# Patient Record
Sex: Male | Born: 1975
Health system: Southern US, Community
[De-identification: ages and names within clinical notes are randomized; demographics above are authoritative.]

## PROBLEM LIST (undated history)

## (undated) DIAGNOSIS — F32A Depression, unspecified: Secondary | ICD-10-CM

## (undated) DIAGNOSIS — F4310 Post-traumatic stress disorder, unspecified: Secondary | ICD-10-CM

## (undated) DIAGNOSIS — G8929 Other chronic pain: Secondary | ICD-10-CM

## (undated) DIAGNOSIS — E119 Type 2 diabetes mellitus without complications: Secondary | ICD-10-CM

## (undated) DIAGNOSIS — F419 Anxiety disorder, unspecified: Secondary | ICD-10-CM

## (undated) DIAGNOSIS — Z8659 Personal history of other mental and behavioral disorders: Secondary | ICD-10-CM

## (undated) HISTORY — PX: CHOLECYSTECTOMY: SHX55

## (undated) HISTORY — PX: KNEE ARTHROSCOPY: SHX127

## (undated) HISTORY — PX: TONSILLECTOMY: SUR1361

## (undated) HISTORY — PX: BACK SURGERY: SHX140

## (undated) HISTORY — PX: APPENDECTOMY: SHX54

## (undated) NOTE — *Deleted (*Deleted)
Patient ID: Juan Roman, male   DOB: [birth year], 16 y.o.   MRN: [id number]   After ED visit 10/20/2020 for hyperglycemia.  From ED note: This patient complains of fatigue and elevated blood sugars in the setting of not taking his insulin; this involves an extensive number of treatment Options and is a complaint that carries with it a high risk of complications and Morbidity. The differential includes hyperglycemia, DKA, HH NK, metabolic derangement, infection  I ordered, reviewed and interpreted labs, which included CBC with elevated white count,, elevated hemoglobin likely reflecting some dehydration, chemistries with mildly low sodium and elevated glucose, normal gap, urinalysis without signs of infection I ordered medication IV fluids, insulin, Tylenol Previous records obtained and reviewed in epic, patient was here recently and had refill of his insulin I consulted social work and discussed lab and imaging findings  Critical Interventions: None  After the interventions stated above, I reevaluated the patient and found patient's blood sugar to be improving.  Social work has been helpful in getting the patient's medication and follow-up appointment set up for [facility].  Patient agreeable to plan to follow-up with them.  Return instructions discussed.

---

## 2020-08-28 ENCOUNTER — Emergency Department (HOSPITAL_BASED_OUTPATIENT_CLINIC_OR_DEPARTMENT_OTHER): Payer: Medicaid Other

## 2020-08-28 ENCOUNTER — Inpatient Hospital Stay (HOSPITAL_BASED_OUTPATIENT_CLINIC_OR_DEPARTMENT_OTHER)
Admission: EM | Admit: 2020-08-28 | Discharge: 2020-08-31 | DRG: 501 | Disposition: A | Discharge status: Home / Self Care | Payer: Medicaid Other | Attending: Internal Medicine | Admitting: Internal Medicine

## 2020-08-28 ENCOUNTER — Other Ambulatory Visit: Payer: Self-pay

## 2020-08-28 ENCOUNTER — Encounter (HOSPITAL_BASED_OUTPATIENT_CLINIC_OR_DEPARTMENT_OTHER): Payer: Self-pay | Admitting: *Deleted

## 2020-08-28 DIAGNOSIS — B950 Streptococcus, group A, as the cause of diseases classified elsewhere: Secondary | ICD-10-CM | POA: Diagnosis present

## 2020-08-28 DIAGNOSIS — L0211 Cutaneous abscess of neck: Secondary | ICD-10-CM | POA: Diagnosis present

## 2020-08-28 DIAGNOSIS — T383X6A Underdosing of insulin and oral hypoglycemic [antidiabetic] drugs, initial encounter: Secondary | ICD-10-CM | POA: Diagnosis present

## 2020-08-28 DIAGNOSIS — Z886 Allergy status to analgesic agent status: Secondary | ICD-10-CM

## 2020-08-28 DIAGNOSIS — E1165 Type 2 diabetes mellitus with hyperglycemia: Secondary | ICD-10-CM | POA: Diagnosis present

## 2020-08-28 DIAGNOSIS — Z794 Long term (current) use of insulin: Secondary | ICD-10-CM | POA: Diagnosis not present

## 2020-08-28 DIAGNOSIS — L03221 Cellulitis of neck: Secondary | ICD-10-CM | POA: Diagnosis present

## 2020-08-28 DIAGNOSIS — I889 Nonspecific lymphadenitis, unspecified: Secondary | ICD-10-CM

## 2020-08-28 DIAGNOSIS — F17210 Nicotine dependence, cigarettes, uncomplicated: Secondary | ICD-10-CM | POA: Diagnosis present

## 2020-08-28 DIAGNOSIS — M60009 Infective myositis, unspecified site: Secondary | ICD-10-CM

## 2020-08-28 DIAGNOSIS — E119 Type 2 diabetes mellitus without complications: Secondary | ICD-10-CM

## 2020-08-28 DIAGNOSIS — Z8614 Personal history of Methicillin resistant Staphylococcus aureus infection: Secondary | ICD-10-CM | POA: Diagnosis not present

## 2020-08-28 DIAGNOSIS — L040 Acute lymphadenitis of face, head and neck: Secondary | ICD-10-CM | POA: Diagnosis present

## 2020-08-28 DIAGNOSIS — E1169 Type 2 diabetes mellitus with other specified complication: Secondary | ICD-10-CM | POA: Diagnosis not present

## 2020-08-28 DIAGNOSIS — Z20822 Contact with and (suspected) exposure to covid-19: Secondary | ICD-10-CM | POA: Diagnosis present

## 2020-08-28 DIAGNOSIS — E1140 Type 2 diabetes mellitus with diabetic neuropathy, unspecified: Secondary | ICD-10-CM | POA: Diagnosis present

## 2020-08-28 DIAGNOSIS — Z887 Allergy status to serum and vaccine status: Secondary | ICD-10-CM

## 2020-08-28 DIAGNOSIS — I671 Cerebral aneurysm, nonruptured: Secondary | ICD-10-CM | POA: Diagnosis present

## 2020-08-28 DIAGNOSIS — Z885 Allergy status to narcotic agent status: Secondary | ICD-10-CM

## 2020-08-28 DIAGNOSIS — M6008 Infective myositis, other site: Principal | ICD-10-CM | POA: Diagnosis present

## 2020-08-28 DIAGNOSIS — Z91138 Patient's unintentional underdosing of medication regimen for other reason: Secondary | ICD-10-CM | POA: Diagnosis not present

## 2020-08-28 HISTORY — DX: Type 2 diabetes mellitus without complications: E11.9

## 2020-08-28 LAB — CBC WITH DIFFERENTIAL/PLATELET
Abs Immature Granulocytes: 0.47 10*3/uL — ABNORMAL HIGH (ref 0.00–0.07)
Basophils Absolute: 0.2 10*3/uL — ABNORMAL HIGH (ref 0.0–0.1)
Basophils Relative: 1 %
Eosinophils Absolute: 0.5 10*3/uL (ref 0.0–0.5)
Eosinophils Relative: 2 %
HCT: 43.1 % (ref 39.0–52.0)
Hemoglobin: 14.5 g/dL (ref 13.0–17.0)
Immature Granulocytes: 2 %
Lymphocytes Relative: 18 %
Lymphs Abs: 4 10*3/uL (ref 0.7–4.0)
MCH: 28.4 pg (ref 26.0–34.0)
MCHC: 33.6 g/dL (ref 30.0–36.0)
MCV: 84.3 fL (ref 80.0–100.0)
Monocytes Absolute: 1.3 10*3/uL — ABNORMAL HIGH (ref 0.1–1.0)
Monocytes Relative: 6 %
Neutro Abs: 15.7 10*3/uL — ABNORMAL HIGH (ref 1.7–7.7)
Neutrophils Relative %: 71 %
Platelets: 298 10*3/uL (ref 150–400)
RBC: 5.11 MIL/uL (ref 4.22–5.81)
RDW: 13.6 % (ref 11.5–15.5)
WBC: 22.1 10*3/uL — ABNORMAL HIGH (ref 4.0–10.5)
nRBC: 0 % (ref 0.0–0.2)

## 2020-08-28 LAB — CBG MONITORING, ED
Glucose-Capillary: 348 mg/dL — ABNORMAL HIGH (ref 70–99)
Glucose-Capillary: 377 mg/dL — ABNORMAL HIGH (ref 70–99)

## 2020-08-28 LAB — URINALYSIS, ROUTINE W REFLEX MICROSCOPIC
Bilirubin Urine: NEGATIVE
Glucose, UA: >=500 mg/dL — AB
Ketones, ur: NEGATIVE mg/dL
Leukocytes,Ua: NEGATIVE
Nitrite: NEGATIVE
Protein, ur: NEGATIVE mg/dL
Specific Gravity, Urine: <1.005 — ABNORMAL LOW (ref 1.005–1.030)
pH: 7 (ref 5.0–8.0)

## 2020-08-28 LAB — BASIC METABOLIC PANEL
Anion gap: 9 (ref 5–15)
BUN: 11 mg/dL (ref 6–20)
CO2: 22 mmol/L (ref 22–32)
Calcium: 8.9 mg/dL (ref 8.9–10.3)
Chloride: 99 mmol/L (ref 98–111)
Creatinine, Ser: 0.63 mg/dL (ref 0.61–1.24)
GFR calc Af Amer: >60 mL/min (ref >60)
GFR calc non Af Amer: >60 mL/min (ref >60)
Glucose, Bld: 553 mg/dL (ref 70–99)
Potassium: 4.2 mmol/L (ref 3.5–5.1)
Sodium: 130 mmol/L — ABNORMAL LOW (ref 135–145)

## 2020-08-28 LAB — RAPID URINE DRUG SCREEN, HOSP PERFORMED
Amphetamines: NOT DETECTED
Barbiturates: NOT DETECTED
Benzodiazepines: NOT DETECTED
Cocaine: NOT DETECTED
Opiates: NOT DETECTED
Tetrahydrocannabinol: POSITIVE — AB

## 2020-08-28 LAB — CK: Total CK: 31 U/L — ABNORMAL LOW (ref 49–397)

## 2020-08-28 LAB — URINALYSIS, MICROSCOPIC (REFLEX)
Bacteria, UA: NONE SEEN
Squamous Epithelial / HPF: NONE SEEN (ref 0–5)
WBC, UA: NONE SEEN WBC/hpf (ref 0–5)

## 2020-08-28 LAB — HEMOGLOBIN A1C
Hgb A1c MFr Bld: 13.3 % — ABNORMAL HIGH (ref 4.8–5.6)
Mean Plasma Glucose: 335.01 mg/dL

## 2020-08-28 LAB — HIV ANTIBODY (ROUTINE TESTING W REFLEX): HIV Screen 4th Generation wRfx: NONREACTIVE

## 2020-08-28 LAB — GLUCOSE, CAPILLARY
Glucose-Capillary: 430 mg/dL — ABNORMAL HIGH (ref 70–99)
Glucose-Capillary: 276 mg/dL — ABNORMAL HIGH (ref 70–99)
Glucose-Capillary: 328 mg/dL — ABNORMAL HIGH (ref 70–99)

## 2020-08-28 LAB — SARS CORONAVIRUS 2 BY RT PCR (HOSPITAL ORDER, PERFORMED IN ~~LOC~~ HOSPITAL LAB): SARS Coronavirus 2: NEGATIVE

## 2020-08-28 LAB — MRSA PCR SCREENING: MRSA by PCR: NEGATIVE

## 2020-08-28 MED ORDER — INSULIN GLARGINE 100 UNIT/ML ~~LOC~~ SOLN
30.0000 [IU] | Freq: Every day | SUBCUTANEOUS | Status: DC
  Administered 2020-08-28 – 2020-08-29 (×2): 30 [IU] via SUBCUTANEOUS
  Filled 2020-08-28 (×4): qty 0.3

## 2020-08-28 MED ORDER — INSULIN ASPART 100 UNIT/ML ~~LOC~~ SOLN
6.0000 [IU] | Freq: Three times a day (TID) | SUBCUTANEOUS | Status: DC
  Administered 2020-08-28 – 2020-08-31 (×7): 6 [IU] via SUBCUTANEOUS

## 2020-08-28 MED ORDER — ACETAMINOPHEN 500 MG PO TABS
1000.0000 mg | ORAL_TABLET | Freq: Once | ORAL | Status: AC
  Administered 2020-08-28: 1000 mg via ORAL
  Filled 2020-08-28: qty 2

## 2020-08-28 MED ORDER — VANCOMYCIN HCL IN DEXTROSE 1-5 GM/200ML-% IV SOLN
1000.0000 mg | Freq: Once | INTRAVENOUS | Status: AC
  Administered 2020-08-28: 1000 mg via INTRAVENOUS
  Filled 2020-08-28: qty 200

## 2020-08-28 MED ORDER — INSULIN ASPART 100 UNIT/ML ~~LOC~~ SOLN
4.0000 [IU] | SUBCUTANEOUS | Status: AC
  Administered 2020-08-28: 4 [IU] via SUBCUTANEOUS
  Filled 2020-08-28: qty 4

## 2020-08-28 MED ORDER — FENTANYL CITRATE (PF) 100 MCG/2ML IJ SOLN
50.0000 ug | Freq: Once | INTRAMUSCULAR | Status: AC
  Administered 2020-08-28: 50 ug via INTRAVENOUS
  Filled 2020-08-28: qty 2

## 2020-08-28 MED ORDER — PIPERACILLIN-TAZOBACTAM 3.375 G IVPB 30 MIN
3.3750 g | Freq: Once | INTRAVENOUS | Status: AC
  Administered 2020-08-28: 3.375 g via INTRAVENOUS
  Filled 2020-08-28 (×2): qty 50

## 2020-08-28 MED ORDER — INSULIN ASPART 100 UNIT/ML ~~LOC~~ SOLN
SUBCUTANEOUS | Status: AC
  Administered 2020-08-28: 5 [IU] via INTRAVENOUS
  Filled 2020-08-28: qty 5

## 2020-08-28 MED ORDER — INSULIN ASPART 100 UNIT/ML ~~LOC~~ SOLN
0.0000 [IU] | Freq: Three times a day (TID) | SUBCUTANEOUS | Status: DC
  Administered 2020-08-28: 8 [IU] via SUBCUTANEOUS
  Administered 2020-08-28 – 2020-08-31 (×7): 15 [IU] via SUBCUTANEOUS

## 2020-08-28 MED ORDER — INSULIN ASPART 100 UNIT/ML ~~LOC~~ SOLN
0.0000 [IU] | Freq: Every day | SUBCUTANEOUS | Status: DC
  Administered 2020-08-28: 4 [IU] via SUBCUTANEOUS
  Administered 2020-08-29: 5 [IU] via SUBCUTANEOUS
  Administered 2020-08-30: 2 [IU] via SUBCUTANEOUS

## 2020-08-28 MED ORDER — INSULIN ASPART 100 UNIT/ML IV SOLN
5.0000 [IU] | Freq: Once | INTRAVENOUS | Status: AC
  Filled 2020-08-28: qty 0.05

## 2020-08-28 MED ORDER — INSULIN REGULAR HUMAN 100 UNIT/ML IJ SOLN
4.0000 [IU] | Freq: Once | INTRAMUSCULAR | Status: DC
  Filled 2020-08-28: qty 3

## 2020-08-28 MED ORDER — ACETAMINOPHEN 500 MG PO TABS
1000.0000 mg | ORAL_TABLET | Freq: Three times a day (TID) | ORAL | Status: DC
  Administered 2020-08-28 – 2020-08-31 (×5): 1000 mg via ORAL
  Filled 2020-08-28 (×8): qty 2

## 2020-08-28 MED ORDER — VANCOMYCIN HCL 1500 MG/300ML IV SOLN
1500.0000 mg | Freq: Two times a day (BID) | INTRAVENOUS | Status: DC
  Administered 2020-08-28 – 2020-08-30 (×5): 1500 mg via INTRAVENOUS
  Filled 2020-08-28 (×5): qty 300

## 2020-08-28 MED ORDER — OXYCODONE HCL 5 MG PO TABS
5.0000 mg | ORAL_TABLET | ORAL | Status: DC | PRN
  Administered 2020-08-28 – 2020-08-29 (×7): 5 mg via ORAL
  Filled 2020-08-28 (×7): qty 1

## 2020-08-28 MED ORDER — PIPERACILLIN-TAZOBACTAM 3.375 G IVPB
3.3750 g | Freq: Three times a day (TID) | INTRAVENOUS | Status: DC
  Administered 2020-08-28 – 2020-08-31 (×9): 3.375 g via INTRAVENOUS
  Filled 2020-08-28 (×8): qty 50

## 2020-08-28 MED ORDER — INSULIN GLARGINE 100 UNIT/ML ~~LOC~~ SOLN
38.0000 [IU] | Freq: Every day | SUBCUTANEOUS | Status: DC
  Filled 2020-08-28: qty 0.38

## 2020-08-28 MED ORDER — ENOXAPARIN SODIUM 40 MG/0.4ML ~~LOC~~ SOLN
40.0000 mg | Freq: Every day | SUBCUTANEOUS | Status: DC
  Filled 2020-08-28: qty 0.4

## 2020-08-28 MED ORDER — IOHEXOL 300 MG/ML  SOLN
100.0000 mL | Freq: Once | INTRAMUSCULAR | Status: AC | PRN
  Administered 2020-08-28: 75 mL via INTRAVENOUS

## 2020-08-28 MED ORDER — MELATONIN 5 MG PO TABS
5.0000 mg | ORAL_TABLET | Freq: Every day | ORAL | Status: DC
  Administered 2020-08-28 – 2020-08-30 (×3): 5 mg via ORAL
  Filled 2020-08-28 (×3): qty 1

## 2020-08-28 MED ORDER — GABAPENTIN 400 MG PO CAPS
400.0000 mg | ORAL_CAPSULE | Freq: Three times a day (TID) | ORAL | Status: DC
  Administered 2020-08-28 – 2020-08-31 (×9): 400 mg via ORAL
  Filled 2020-08-28 (×9): qty 1

## 2020-08-28 MED ORDER — ACETAMINOPHEN 500 MG PO TABS
1000.0000 mg | ORAL_TABLET | Freq: Four times a day (QID) | ORAL | Status: DC | PRN
  Administered 2020-08-28: 1000 mg via ORAL
  Filled 2020-08-28: qty 2

## 2020-08-28 MED ORDER — MORPHINE SULFATE (PF) 4 MG/ML IV SOLN
4.0000 mg | INTRAVENOUS | Status: DC | PRN
  Administered 2020-08-28 – 2020-08-29 (×2): 4 mg via INTRAVENOUS
  Filled 2020-08-28 (×2): qty 1

## 2020-08-28 MED ORDER — SODIUM CHLORIDE 0.9 % IV BOLUS
500.0000 mL | Freq: Once | INTRAVENOUS | Status: AC
  Administered 2020-08-28: 500 mL via INTRAVENOUS

## 2020-08-28 MED ORDER — LACTATED RINGERS IV SOLN: INTRAVENOUS | Status: DC

## 2020-08-28 MED ORDER — HALOPERIDOL LACTATE 5 MG/ML IJ SOLN
2.0000 mg | Freq: Once | INTRAMUSCULAR | Status: AC
  Administered 2020-08-28: 2 mg via INTRAVENOUS
  Filled 2020-08-28: qty 1

## 2020-08-28 NOTE — Progress Notes (Signed)
Received patient from ED Med center, and only got report from [facility] nurse.

## 2020-08-28 NOTE — Progress Notes (Signed)
Attempted to return call ED nurse Med center to get report, secretary said RN busy.

## 2020-08-28 NOTE — ED Triage Notes (Signed)
Per EMS: pt from hotel. States that pt has had neck pain with swelling (x 2 months-without injury). Pt a diabetic-noncompliant with meds. CBG 539. VSS. 18ga LAC, 500cc bolus.

## 2020-08-28 NOTE — ED Notes (Signed)
Attempted to call report to floor, no answer by primary RN

## 2020-08-28 NOTE — Consult Note (Signed)
[facility] Surgery Consult Note  Juan Roman 1976/05/18  [id number].    Requesting MD: Chipper Herb Chief Complaint/Reason for Consult: posterior neck abscess HPI:  Patient is a 44 year old male who presented to [facility] with hyperglycemia and neck swelling x1 week. He reports he noticed neck swelling about a week ago and tenderness. Pain and swelling has progressively worsened since onset. He denies fever, chills, chest pain, SOB, abdominal pain, nausea, vomiting, change in peripheral neuropathy. He reports that he has had a skin infection on his back previously that had to be drained and packed. He reports he has not been taking insulin as directed because he had been providing care for someone else. His "old lady" died within the last 2 weeks and this has been very emotionally tough for him. He does not report any other medical issues. Past surgical hx includes appendectomy and cholecystectomy. He is allergic to tramadol and toradol with hives as reaction. He does not take any blood thinning medications. He reports rare alcohol use, smokes 1/2 ppd, reports occasional marijuana use. 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 He is on disability.   ROS: Review of Systems  Constitutional: Negative for chills and fever.  Respiratory: Negative for shortness of breath and wheezing.   Cardiovascular: Negative for chest pain and palpitations.  Gastrointestinal: Negative for abdominal pain, constipation, diarrhea, nausea and vomiting.  Genitourinary: Negative for dysuria, frequency and urgency.  Musculoskeletal: Positive for neck pain.  All other systems reviewed and are negative.   History reviewed. No pertinent family history.  Past Medical History:  Diagnosis Date  . Diabetes mellitus without complication [facility])     Past Surgical History:  Procedure Laterality Date  . BACK SURGERY    . TONSILLECTOMY      Social History:  reports that he has been smoking cigarettes. He has  been smoking about 1.00 pack per day. He has never used smokeless tobacco. He reports previous alcohol use. He reports current drug use. Drug: Marijuana.  Allergies:  Allergies  Allergen Reactions  . Influenza Virus Vac Live Quad Anaphylaxis  . Nsaids Nausea And Vomiting  . Toradol [Ketorolac Tromethamine]   . Tramadol     Medications Prior to Admission  Medication Sig Dispense Refill  . gabapentin (NEURONTIN) 400 MG capsule Take 400 mg by mouth 4 (four) times daily as needed.    Marland Kitchen HUMULIN R 100 UNIT/ML injection Inject 10 Units into the skin 3 (three) times daily.    Marland Kitchen LANTUS 100 UNIT/ML injection Inject 38 Units into the skin at bedtime.      Blood pressure (!) 135/92, pulse (!) 101, temperature 99.6 F (37.6 C), temperature source Oral, resp. rate 18, height 5\' 7"  (1.702 m), weight 86.2 kg, SpO2 97 %. Physical Exam:  General: pleasant, WD, WN white male who is laying in bed in NAD HEENT: head is normocephalic, atraumatic.  Sclera are noninjected.  PERRL.  Ears and nose without any masses or lesions.  Mouth is pink and moist Neck: posterior left neck with erythema and induration extending down to left subclavian space, very ttp  Heart: regular, rate, and rhythm. Palpable radial and pedal pulses bilaterally Lungs: CTAB, no wheezes, rhonchi, or rales noted.  Respiratory effort nonlabored Abd: soft, NT, ND, +BS, no masses, hernias, or organomegaly MS: all 4 extremities are symmetrical with no cyanosis, clubbing, or edema. Skin: warm and dry with no masses, lesions, or rashes Neuro: Cranial nerves 2-12 grossly intact, sensation grossly  intact throughout  Psych: A&Ox3 with an appropriate affect.   Results for orders placed or performed during the hospital encounter of 08/28/20 (from the past 48 hour(s))  CBC with Differential/Platelet     Status: Abnormal   Collection Time: 08/28/20  3:13 AM  Result Value Ref Range   WBC 22.1 (H) 4.0 - 10.5 K/uL   RBC 5.11 4.22 - 5.81 MIL/uL    Hemoglobin 14.5 13.0 - 17.0 g/dL   HCT 21.3 39 - 52 %   MCV 84.3 80.0 - 100.0 fL   MCH 28.4 26.0 - 34.0 pg   MCHC 33.6 30.0 - 36.0 g/dL   RDW 08.6 57.8 - 46.9 %   Platelets 298 150 - 400 K/uL   nRBC 0.0 0.0 - 0.2 %   Neutrophils Relative % 71 %   Neutro Abs 15.7 (H) 1.7 - 7.7 K/uL   Lymphocytes Relative 18 %   Lymphs Abs 4.0 0.7 - 4.0 K/uL   Monocytes Relative 6 %   Monocytes Absolute 1.3 (H) 0 - 1 K/uL   Eosinophils Relative 2 %   Eosinophils Absolute 0.5 0 - 0 K/uL   Basophils Relative 1 %   Basophils Absolute 0.2 (H) 0 - 0 K/uL   Immature Granulocytes 2 %   Abs Immature Granulocytes 0.47 (H) 0.00 - 0.07 K/uL    Comment: Performed at [facility], [address] [place], [place] [address]  Basic metabolic panel     Status: Abnormal   Collection Time: 08/28/20  3:13 AM  Result Value Ref Range   Sodium 130 (L) 135 - 145 mmol/L   Potassium 4.2 3.5 - 5.1 mmol/L   Chloride 99 98 - 111 mmol/L   CO2 22 22 - 32 mmol/L   Glucose, Bld 553 (HH) 70 - 99 mg/dL    Comment: Glucose reference range applies only to samples taken after fasting for at least 8 hours. CRITICAL RESULT CALLED TO, READ BACK BY AND VERIFIED WITH: WALTON,M AT 0354 ON [id number] BY CHERESNOWSKY,T LIPEMIC SPECIMEN    BUN 11 6 - 20 mg/dL   Creatinine, Ser 4.01 0.61 - 1.24 mg/dL   Calcium 8.9 8.9 - 02.7 mg/dL   GFR calc non Af Amer >60 >60 mL/min   GFR calc Af Amer >60 >60 mL/min   Anion gap 9 5 - 15    Comment: Performed at [facility], [address] [place], [place] [address]  Urinalysis, Routine w reflex microscopic Urine, Clean Catch     Status: Abnormal   Collection Time: 08/28/20  3:13 AM  Result Value Ref Range   Color, Urine COLORLESS (A) YELLOW   APPearance CLEAR CLEAR   Specific Gravity, Urine <1.005 (L) 1.005 - 1.030   pH 7.0 5.0 - 8.0   Glucose, UA >=500 (A) NEGATIVE mg/dL   Hgb urine dipstick TRACE (A) NEGATIVE   Bilirubin Urine NEGATIVE NEGATIVE   Ketones, ur NEGATIVE  NEGATIVE mg/dL   Protein, ur NEGATIVE NEGATIVE mg/dL   Nitrite NEGATIVE NEGATIVE   Leukocytes,Ua NEGATIVE NEGATIVE    Comment: Performed at [facility], [address] [place], [place] [address]  Urinalysis, Microscopic (reflex)     Status: None   Collection Time: 08/28/20  3:13 AM  Result Value Ref Range   RBC / HPF 0-5 0 - 5 RBC/hpf   WBC, UA NONE SEEN 0 - 5 WBC/hpf   Bacteria, UA NONE SEEN NONE SEEN   Squamous Epithelial /  LPF NONE SEEN 0 - 5    Comment: Performed at [facility] [place], [address] [place], [place] [address]  SARS Coronavirus 2 by RT PCR (hospital order, performed in [facility] hospital lab) Nasopharyngeal Nasopharyngeal Swab     Status: None   Collection Time: 08/28/20  6:16 AM   Specimen: Nasopharyngeal Swab  Result Value Ref Range   SARS Coronavirus 2 NEGATIVE NEGATIVE    Comment: (NOTE) SARS-CoV-2 target nucleic acids are NOT DETECTED.  The SARS-CoV-2 RNA is generally detectable in upper and lower respiratory specimens during the acute phase of infection. The lowest concentration of SARS-CoV-2 viral copies this assay can detect is 250 copies / mL. A negative result does not preclude SARS-CoV-2 infection and should not be used as the sole basis for treatment or other patient management decisions.  A negative result may occur with improper specimen collection / handling, submission of specimen other than nasopharyngeal swab, presence of viral mutation(s) within the areas targeted by this assay, and inadequate number of viral copies (<250 copies / mL). A negative result must be combined with clinical observations, patient history, and epidemiological information.  Fact Sheet for Patients:   BoilerBrush.com.cyhttps://www.fda.gov/media/136312/download  Fact Sheet for Healthcare Providers: https://pope.com/https://www.fda.gov/media/136313/download  This test is not yet approved or  cleared by the [place] FDA and has been authorized for detection and/or diagnosis of  SARS-CoV-2 by FDA under an Emergency Use Authorization (EUA).  This EUA will remain in effect (meaning this test can be used) for the duration of the COVID-19 declaration under Section 564(b)(1) of the Act, 21 U.S.C. section 360bbb-3(b)(1), unless the authorization is terminated or revoked sooner.  Performed at [facility], [address] [place], [place] [address]   Rapid urine drug screen (hospital performed)     Status: Abnormal   Collection Time: 08/28/20  6:21 AM  Result Value Ref Range   Opiates NONE DETECTED NONE DETECTED   Cocaine NONE DETECTED NONE DETECTED   Benzodiazepines NONE DETECTED NONE DETECTED   Amphetamines NONE DETECTED NONE DETECTED   Tetrahydrocannabinol POSITIVE (A) NONE DETECTED   Barbiturates NONE DETECTED NONE DETECTED    Comment: (NOTE) DRUG SCREEN FOR MEDICAL PURPOSES ONLY.  IF CONFIRMATION IS NEEDED FOR ANY PURPOSE, NOTIFY LAB WITHIN 5 DAYS.  LOWEST DETECTABLE LIMITS FOR URINE DRUG SCREEN Drug Class                     Cutoff (ng/mL) Amphetamine and metabolites    1000 Barbiturate and metabolites    200 Benzodiazepine                 200 Tricyclics and metabolites     300 Opiates and metabolites        300 Cocaine and metabolites        300 THC                            50 Performed at [facility] [place], [address] [place], [place] [address]   CBG monitoring, ED     Status: Abnormal   Collection Time: 08/28/20  7:42 AM  Result Value Ref Range   Glucose-Capillary 348 (H) 70 - 99 mg/dL    Comment: Glucose reference range applies only to samples taken after fasting for at least 8 hours.  CBG monitoring, ED     Status: Abnormal   Collection Time: 08/28/20 11:18 AM  Result Value Ref Range   Glucose-Capillary 377 (H) 70 - 99 mg/dL    Comment: Glucose reference range applies only to samples taken after fasting for at least 8 hours.  Glucose, capillary     Status: Abnormal   Collection Time: 08/28/20 12:40 PM  Result  Value Ref Range   Glucose-Capillary 430 (H) 70 - 99 mg/dL    Comment: Glucose reference range applies only to samples taken after fasting for at least 8 hours.   CT Soft Tissue Neck W Contrast  Result Date: 08/28/2020 CLINICAL DATA:  Neck pain with swelling for 2 months. Non compliant diabetic EXAM: CT NECK WITH CONTRAST TECHNIQUE: Multidetector CT imaging of the neck was performed using the standard protocol following the bolus administration of intravenous contrast. CONTRAST:  69mL OMNIPAQUE IOHEXOL 300 MG/ML  SOLN COMPARISON:  None. FINDINGS: Pharynx and larynx: No evidence of mass or swelling. Salivary glands: No inflammation, mass, or stone. Thyroid: Normal. Lymph nodes: There are 2 thick-walled cavities along the junction between the subcutaneous fat and deep fascia the level of the left posterior neck, measuring up to 2 cm. Suspect these are cavitary nodes. No contralateral or non cavitated adenopathy. 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 No gross beading of the contralateral ICA. Limited intracranial: Negative Visualized orbits: Negative Mastoids and visualized paranasal sinuses: Clear Skeleton: Muscular expansion and low-density in the left intrinsic neck muscles. Extensive dental caries and periapical erosions. Degenerative endplate spurring. Upper chest: Negative IMPRESSION: 1. Extensive myositis of left posterior intrinsic neck musculature associated with fluid collections along the deep fascial layer, likely cavitated nodes. Suppurative adenitis or tuberculous adenitis are leading considerations. Would not expect this degree of inflammation for cavitating tumor. 2. 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 In this location a posttraumatic cause is likely. Electronically Signed   By: Marnee Spring M.D.   On: 08/28/2020 05:18   DG Chest Portable 1 View  Result Date: 08/28/2020 CLINICAL DATA:   Abnormal CT. EXAM: PORTABLE CHEST 1 VIEW COMPARISON:  CT neck 9 08/2020 FINDINGS: Mediastinum and hilar structures normal. Heart size normal. Low lung volumes. Tiny calcified nodule left lung base suggesting tiny calcified granuloma. No focal infiltrate. No evidence of active TB. No pleural effusion or pneumothorax. No acute bony abnormality. IMPRESSION: Tiny calcified nodule left lung base suggesting tiny calcified granuloma. No focal infiltrate. No evidence of active TB. Electronically Signed   By: Maisie Fus  Register   On: 08/28/2020 05:53      Assessment/Plan Uncontrolled diabetes mellitus - per primary team   Posterior neck abscess and cellulitis  - WBC 22, afeb - CT shows extensive myositis of left posterior neck with fluid collections along the deep fascial later - this is too deep to drain at bedside - patient has just eaten lunch so will plan for I&D in OR tomorrow   Juliet Rude, PA-C [facility] 08/28/2020, 2:45 PM Please see Amion for pager number during day hours 7:00am-4:30pm

## 2020-08-28 NOTE — ED Notes (Signed)
Patient transported to CT 

## 2020-08-28 NOTE — ED Provider Notes (Signed)
[facility] EMERGENCY DEPARTMENT Provider Note   CSN: [id number] Arrival date & time: 08/28/20  0241     History Chief Complaint  Patient presents with  . Hyperglycemia    Juan Roman is a 44 y.o. male.  The history is provided by the patient and medical records.  Hyperglycemia Blood sugar level PTA:  Unknown has been off meds for weeks  Severity:  Severe Onset quality:  Gradual Timing:  Constant Progression:  Worsening Chronicity:  Chronic Diabetes status:  Controlled with insulin Current diabetic therapy:  Insulin  Context: noncompliance   Relieved by:  Nothing Ineffective treatments:  None tried Associated symptoms: no abdominal pain, no altered mental status, no dizziness, no dysuria, no fatigue, no fever, no increased appetite, no increased thirst, no malaise, no nausea, no polyuria, no shortness of breath, no syncope, no vomiting, no weakness and no weight change   Associated symptoms comment:  Posterior left neck swelling x 2 months Risk factors: no pancreatic disease   Patient with h/o IVDA type 1 DM with non presents with non compliance presents with hyperglycemia and Left posterior neck swelling x 2 months.  No f/c/r.  No sore throat.  No change in phonation.  Has not used insulin in several weeks.       Past Medical History:  Diagnosis Date  . Diabetes mellitus without complication [facility])     Patient Active Problem List   Diagnosis Date Noted  . Cellulitis of neck 08/28/2020    Past Surgical History:  Procedure Laterality Date  . BACK SURGERY    . TONSILLECTOMY         History reviewed. No pertinent family history.  Social History   Tobacco Use  . Smoking status: Current Every Day Smoker    Packs/day: 1.00    Types: Cigarettes  . Smokeless tobacco: Never Used  Substance Use Topics  . Alcohol use: Not Currently  . Drug use: Yes    Types: Marijuana    Home Medications Prior to Admission medications   Not on File     Allergies    Toradol [ketorolac tromethamine] and Tramadol  Review of Systems   Review of Systems  Constitutional: Negative for fatigue and fever.  Respiratory: Negative for shortness of breath.   Cardiovascular: Negative for syncope.  Gastrointestinal: Negative for abdominal pain, nausea and vomiting.  Endocrine: Negative for polydipsia and polyuria.  Genitourinary: Negative for dysuria.  Musculoskeletal: Positive for neck pain.  Skin: Negative for rash.  Neurological: Negative for dizziness and weakness.  Psychiatric/Behavioral: Negative for agitation.  All other systems reviewed and are negative.   Physical Exam Updated Vital Signs BP (!) 131/95 (BP Location: Right Arm)   Pulse 98   Temp 99.3 F (37.4 C) (Oral)   Resp 18   Ht 5\' 7"  (1.702 m)   Wt 86.2 kg   SpO2 97%   BMI 29.76 kg/m   Physical Exam Vitals and nursing note reviewed.  Constitutional:      General: He is not in acute distress.    Appearance: Normal appearance.  HENT:     Head: Normocephalic and atraumatic.     Nose: Nose normal.     Mouth/Throat:     Mouth: Mucous membranes are moist.  Eyes:     Conjunctiva/sclera: Conjunctivae normal.     Pupils: Pupils are equal, round, and reactive to light.  Neck:   Cardiovascular:     Rate and Rhythm: Normal rate and regular rhythm.  Pulses: Normal pulses.     Heart sounds: Normal heart sounds.  Pulmonary:     Effort: Pulmonary effort is normal.     Breath sounds: Normal breath sounds.  Abdominal:     General: Abdomen is flat. Bowel sounds are normal.     Palpations: Abdomen is soft.     Tenderness: There is no abdominal tenderness. There is no guarding or rebound.  Musculoskeletal:        General: Normal range of motion.     Cervical back: No rigidity.  Skin:    General: Skin is warm and dry.     Capillary Refill: Capillary refill takes less than 2 seconds.     Comments: Skin popping site on B forearms dorsal and volar   Neurological:      General: No focal deficit present.     Mental Status: He is alert and oriented to person, place, and time.  Psychiatric:        Mood and Affect: Mood normal.        Behavior: Behavior normal.     ED Results / Procedures / Treatments   Labs (all labs ordered are listed, but only abnormal results are displayed) Results for orders placed or performed during the hospital encounter of 08/28/20  CBC with Differential/Platelet  Result Value Ref Range   WBC 22.1 (H) 4.0 - 10.5 K/uL   RBC 5.11 4.22 - 5.81 MIL/uL   Hemoglobin 14.5 13.0 - 17.0 g/dL   HCT 52.843.1 39 - 52 %   MCV 84.3 80.0 - 100.0 fL   MCH 28.4 26.0 - 34.0 pg   MCHC 33.6 30.0 - 36.0 g/dL   RDW 41.313.6 24.411.5 - 01.015.5 %   Platelets 298 150 - 400 K/uL   nRBC 0.0 0.0 - 0.2 %   Neutrophils Relative % 71 %   Neutro Abs 15.7 (H) 1.7 - 7.7 K/uL   Lymphocytes Relative 18 %   Lymphs Abs 4.0 0.7 - 4.0 K/uL   Monocytes Relative 6 %   Monocytes Absolute 1.3 (H) 0 - 1 K/uL   Eosinophils Relative 2 %   Eosinophils Absolute 0.5 0 - 0 K/uL   Basophils Relative 1 %   Basophils Absolute 0.2 (H) 0 - 0 K/uL   Immature Granulocytes 2 %   Abs Immature Granulocytes 0.47 (H) 0.00 - 0.07 K/uL  Basic metabolic panel  Result Value Ref Range   Sodium 130 (L) 135 - 145 mmol/L   Potassium 4.2 3.5 - 5.1 mmol/L   Chloride 99 98 - 111 mmol/L   CO2 22 22 - 32 mmol/L   Glucose, Bld 553 (HH) 70 - 99 mg/dL   BUN 11 6 - 20 mg/dL   Creatinine, Ser 2.720.63 0.61 - 1.24 mg/dL   Calcium 8.9 8.9 - 53.610.3 mg/dL   GFR calc non Af Amer >60 >60 mL/min   GFR calc Af Amer >60 >60 mL/min   Anion gap 9 5 - 15  Urinalysis, Routine w reflex microscopic Urine, Clean Catch  Result Value Ref Range   Color, Urine COLORLESS (A) YELLOW   APPearance CLEAR CLEAR   Specific Gravity, Urine <1.005 (L) 1.005 - 1.030   pH 7.0 5.0 - 8.0   Glucose, UA >=500 (A) NEGATIVE mg/dL   Hgb urine dipstick TRACE (A) NEGATIVE   Bilirubin Urine NEGATIVE NEGATIVE   Ketones, ur NEGATIVE NEGATIVE mg/dL    Protein, ur NEGATIVE NEGATIVE mg/dL   Nitrite NEGATIVE NEGATIVE   Leukocytes,Ua NEGATIVE NEGATIVE  Urinalysis, Microscopic (reflex)  Result Value Ref Range   RBC / HPF 0-5 0 - 5 RBC/hpf   WBC, UA NONE SEEN 0 - 5 WBC/hpf   Bacteria, UA NONE SEEN NONE SEEN   Squamous Epithelial / LPF NONE SEEN 0 - 5   CT Soft Tissue Neck W Contrast  Result Date: 08/28/2020 CLINICAL DATA:  Neck pain with swelling for 2 months. Non compliant diabetic EXAM: CT NECK WITH CONTRAST TECHNIQUE: Multidetector CT imaging of the neck was performed using the standard protocol following the bolus administration of intravenous contrast. CONTRAST:  39mL OMNIPAQUE IOHEXOL 300 MG/ML  SOLN COMPARISON:  None. FINDINGS: Pharynx and larynx: No evidence of mass or swelling. Salivary glands: No inflammation, mass, or stone. Thyroid: Normal. Lymph nodes: There are 2 thick-walled cavities along the junction between the subcutaneous fat and deep fascia the level of the left posterior neck, measuring up to 2 cm. Suspect these are cavitary nodes. No contralateral or non cavitated adenopathy. 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 No gross beading of the contralateral ICA. Limited intracranial: Negative Visualized orbits: Negative Mastoids and visualized paranasal sinuses: Clear Skeleton: Muscular expansion and low-density in the left intrinsic neck muscles. Extensive dental caries and periapical erosions. Degenerative endplate spurring. Upper chest: Negative IMPRESSION: 1. Extensive myositis of left posterior intrinsic neck musculature associated with fluid collections along the deep fascial layer, likely cavitated nodes. Suppurative adenitis or tuberculous adenitis are leading considerations. Would not expect this degree of inflammation for cavitating tumor. 2. 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 In this location a posttraumatic cause is  likely. Electronically Signed   By: Marnee Spring M.D.   On: 08/28/2020 05:18   DG Chest Portable 1 View  Result Date: 08/28/2020 CLINICAL DATA:  Abnormal CT. EXAM: PORTABLE CHEST 1 VIEW COMPARISON:  CT neck 9 08/2020 FINDINGS: Mediastinum and hilar structures normal. Heart size normal. Low lung volumes. Tiny calcified nodule left lung base suggesting tiny calcified granuloma. No focal infiltrate. No evidence of active TB. No pleural effusion or pneumothorax. No acute bony abnormality. IMPRESSION: Tiny calcified nodule left lung base suggesting tiny calcified granuloma. No focal infiltrate. No evidence of active TB. Electronically Signed   By: Maisie Fus  Register   On: 08/28/2020 05:53    None  Radiology CT Soft Tissue Neck W Contrast  Result Date: 08/28/2020 CLINICAL DATA:  Neck pain with swelling for 2 months. Non compliant diabetic EXAM: CT NECK WITH CONTRAST TECHNIQUE: Multidetector CT imaging of the neck was performed using the standard protocol following the bolus administration of intravenous contrast. CONTRAST:  13mL OMNIPAQUE IOHEXOL 300 MG/ML  SOLN COMPARISON:  None. FINDINGS: Pharynx and larynx: No evidence of mass or swelling. Salivary glands: No inflammation, mass, or stone. Thyroid: Normal. Lymph nodes: There are 2 thick-walled cavities along the junction between the subcutaneous fat and deep fascia the level of the left posterior neck, measuring up to 2 cm. Suspect these are cavitary nodes. No contralateral or non cavitated adenopathy. 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 No gross beading of the contralateral ICA. Limited intracranial: Negative Visualized orbits: Negative Mastoids and visualized paranasal sinuses: Clear Skeleton: Muscular expansion and low-density in the left intrinsic neck muscles. Extensive dental caries and periapical erosions. Degenerative endplate spurring. Upper chest: Negative IMPRESSION: 1.  Extensive myositis of left posterior intrinsic  neck musculature associated with fluid collections along the deep fascial layer, likely cavitated nodes. Suppurative adenitis or tuberculous adenitis are leading considerations. Would not expect this degree of inflammation for cavitating tumor. 2. 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 In this location a posttraumatic cause is likely. Electronically Signed   By: Marnee Spring M.D.   On: 08/28/2020 05:18   DG Chest Portable 1 View  Result Date: 08/28/2020 CLINICAL DATA:  Abnormal CT. EXAM: PORTABLE CHEST 1 VIEW COMPARISON:  CT neck 9 08/2020 FINDINGS: Mediastinum and hilar structures normal. Heart size normal. Low lung volumes. Tiny calcified nodule left lung base suggesting tiny calcified granuloma. No focal infiltrate. No evidence of active TB. No pleural effusion or pneumothorax. No acute bony abnormality. IMPRESSION: Tiny calcified nodule left lung base suggesting tiny calcified granuloma. No focal infiltrate. No evidence of active TB. Electronically Signed   By: Maisie Fus  Register   On: 08/28/2020 05:53    Procedures Procedures (including critical care time)  Medications Ordered in ED Medications  piperacillin-tazobactam (ZOSYN) IVPB 3.375 g (3.375 g Intravenous New Bag/Given 08/28/20 0633)  acetaminophen (TYLENOL) tablet 1,000 mg (1,000 mg Oral Given 08/28/20 0438)  sodium chloride 0.9 % bolus 500 mL ( Intravenous Stopped 08/28/20 0542)  iohexol (OMNIPAQUE) 300 MG/ML solution 100 mL (75 mLs Intravenous Contrast Given 08/28/20 0451)  vancomycin (VANCOCIN) IVPB 1000 mg/200 mL premix (1,000 mg Intravenous New Bag/Given 08/28/20 0509)  haloperidol lactate (HALDOL) injection 2 mg (2 mg Intravenous Given 08/28/20 0504)  fentaNYL (SUBLIMAZE) injection 50 mcg (50 mcg Intravenous Given 08/28/20 0610)  insulin aspart (novoLOG) injection 4 Units (4 Units Subcutaneous Given 08/28/20 0608)    ED Course  I have reviewed the triage vital signs  and the nursing notes.  Pertinent labs & imaging results that were available during my care of the patient were reviewed by me and considered in my medical decision making (see chart for details).   Final Clinical Impression(s) / ED Diagnoses Final diagnoses:  Infective myositis, unspecified site  Lymphadenitis   Admit to medicine.    Dj Senteno, MD 08/28/20 [phone]

## 2020-08-28 NOTE — H&P (Signed)
History and Physical    [name] QIO:[id number] DOB: [birth year] DOA: 08/28/2020  PCP: Patient, No Pcp Per (Confirm with patient/family/NH records and if not entered, this has to be entered at [facility] point of entry) Patient coming from: Home  I have personally briefly reviewed patient's old medical records in [facility]  Chief Complaint: Neck pain and swelling  HPI: Juan Roman is a 44 y.o. male with medical history significant of IIDM poorly controlled in the past with A1c>9, diabetic neuropathy, disabled with degenerative lumbar spine authorities, recurrent MRSA infection, presented with new onset of neck swelling pain and subjective fever.  Symptoms started about 1 week ago, initially was pain only then started to swell, hard to turn neck to the left which according severe pain.  Denies any numbness weakness of any of the fingers or arms.  Neck pain is localized shooting.  Denies any IV drug use.  Denies any night sweats or weight loss.  No history of TB infection.  Patient has 2 MRSA infection/abscess last year when he lived in [place].  One was on the upper back and the other one on the lower back. ED Course: Physical exam shows significant posterior left-sided neck swelling rash and severe tenderness.  White count 22, glucose 550.  CT of the neck showed 2 thick walled cavity along the junction between subcutaneous fat and deep fascia at the level of left posterior neck measuring 2 cm in diameter suspect cavity nodes.  Review of Systems: As per HPI otherwise 14 point review of systems negative.    Past Medical History:  Diagnosis Date   Diabetes mellitus without complication (HCC)     Past Surgical History:  Procedure Laterality Date   APPENDECTOMY     BACK SURGERY     CHOLECYSTECTOMY     TONSILLECTOMY       reports that he has been smoking cigarettes. He has been smoking about 1.00 pack per day. He has never used smokeless tobacco. He reports previous alcohol use.  He reports current drug use. Drug: Marijuana.  Allergies  Allergen Reactions   Influenza Virus Vac Live Quad Anaphylaxis   Nsaids Nausea And Vomiting   Toradol [Ketorolac Tromethamine]    Tramadol     History reviewed. No pertinent family history.   Prior to Admission medications   Medication Sig Start Date End Date Taking? Authorizing Provider  gabapentin (NEURONTIN) 400 MG capsule Take 400 mg by mouth 4 (four) times daily as needed. 08/05/20  Yes [provider]  HUMULIN R 100 UNIT/ML injection Inject 10 Units into the skin 3 (three) times daily. 03/07/20  Yes [provider]  LANTUS 100 UNIT/ML injection Inject 38 Units into the skin at bedtime. 04/11/20  Yes [provider]    Physical Exam: Vitals:   08/28/20 0830 08/28/20 1000 08/28/20 1100 08/28/20 1236  BP: (!) 128/94 124/88 130/87 (!) 135/92  Pulse: 91 94 90 (!) 101  Resp: (!) 28 20 20 18   Temp:    99.6 F (37.6 C)  TempSrc:    Oral  SpO2: 97% 94% 93% 97%  Weight:      Height:        Constitutional: NAD, calm, comfortable Vitals:   08/28/20 0830 08/28/20 1000 08/28/20 1100 08/28/20 1236  BP: (!) 128/94 124/88 130/87 (!) 135/92  Pulse: 91 94 90 (!) 101  Resp: (!) 28 20 20 18   Temp:    99.6 F (37.6 C)  TempSrc:    Oral  SpO2:  97% 94% 93% 97%  Weight:      Height:       Eyes: PERRL, lids and conjunctivae normal ENMT: Mucous membranes are moist. Posterior pharynx clear of any exudate or lesions.Normal dentition.  Neck: Left posterior neck fullness, skin looks erythematous with warmness and swelling and positive for undulation but with severe tenderness, no significant anterior lymphadenopathy palpable Respiratory: clear to auscultation bilaterally, no wheezing, no crackles. Normal respiratory effort. No accessory muscle use.  Cardiovascular: Regular rate and rhythm, no murmurs / rubs / gallops. No extremity edema. 2+ pedal pulses. No carotid bruits.  Abdomen: no tenderness, no  masses palpated. No hepatosplenomegaly. Bowel sounds positive.  Musculoskeletal: no clubbing / cyanosis. No joint deformity upper and lower extremities. Good ROM, no contractures. Normal muscle tone.  Skin: no rashes, lesions, ulcers. No induration Neurologic: CN 2-12 grossly intact. Sensation intact, DTR normal. Strength 5/5 in all 4.  Psychiatric: Normal judgment and insight. Alert and oriented x 3. Normal mood.     Labs on Admission: I have personally reviewed following labs and imaging studies  CBC: Recent Labs  Lab 08/28/20 0313  WBC 22.1*  NEUTROABS 15.7*  HGB 14.5  HCT 43.1  MCV 84.3  PLT 298   Basic Metabolic Panel: Recent Labs  Lab 08/28/20 0313  NA 130*  K 4.2  CL 99  CO2 22  GLUCOSE 553*  BUN 11  CREATININE 0.63  CALCIUM 8.9   GFR: Estimated Creatinine Clearance: 127.4 mL/min (by C-G formula based on SCr of 0.63 mg/dL). Liver Function Tests: No results for input(s): AST, ALT, ALKPHOS, BILITOT, PROT, ALBUMIN in the last 168 hours. No results for input(s): LIPASE, AMYLASE in the last 168 hours. No results for input(s): AMMONIA in the last 168 hours. Coagulation Profile: No results for input(s): INR, PROTIME in the last 168 hours. Cardiac Enzymes: No results for input(s): CKTOTAL, CKMB, CKMBINDEX, TROPONINI in the last 168 hours. BNP (last 3 results) No results for input(s): PROBNP in the last 8760 hours. HbA1C: Recent Labs    08/28/20 1517  HGBA1C 13.3*   CBG: Recent Labs  Lab 08/28/20 0742 08/28/20 1118 08/28/20 1240  GLUCAP 348* 377* 430*   Lipid Profile: No results for input(s): CHOL, HDL, LDLCALC, TRIG, CHOLHDL, LDLDIRECT in the last 72 hours. Thyroid Function Tests: No results for input(s): TSH, T4TOTAL, FREET4, T3FREE, THYROIDAB in the last 72 hours. Anemia Panel: No results for input(s): VITAMINB12, FOLATE, FERRITIN, TIBC, IRON, RETICCTPCT in the last 72 hours. Urine analysis:    Component Value Date/Time   COLORURINE COLORLESS (A)  08/28/2020 0313   APPEARANCEUR CLEAR 08/28/2020 0313   LABSPEC <1.005 (L) 08/28/2020 0313   PHURINE 7.0 08/28/2020 0313   GLUCOSEU >=500 (A) 08/28/2020 0313   HGBUR TRACE (A) 08/28/2020 0313   BILIRUBINUR NEGATIVE 08/28/2020 0313   KETONESUR NEGATIVE 08/28/2020 0313   PROTEINUR NEGATIVE 08/28/2020 0313   NITRITE NEGATIVE 08/28/2020 0313   LEUKOCYTESUR NEGATIVE 08/28/2020 0313    Radiological Exams on Admission: CT Soft Tissue Neck W Contrast  Result Date: 08/28/2020 CLINICAL DATA:  Neck pain with swelling for 2 months. Non compliant diabetic EXAM: CT NECK WITH CONTRAST TECHNIQUE: Multidetector CT imaging of the neck was performed using the standard protocol following the bolus administration of intravenous contrast. CONTRAST:  67mL OMNIPAQUE IOHEXOL 300 MG/ML  SOLN COMPARISON:  None. FINDINGS: Pharynx and larynx: No evidence of mass or swelling. Salivary glands: No inflammation, mass, or stone. Thyroid: Normal. Lymph nodes: There are 2 thick-walled cavities along the  junction between the subcutaneous fat and deep fascia the level of the left posterior neck, measuring up to 2 cm. Suspect these are cavitary nodes. No contralateral or non cavitated adenopathy. 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 No gross beading of the contralateral ICA. Limited intracranial: Negative Visualized orbits: Negative Mastoids and visualized paranasal sinuses: Clear Skeleton: Muscular expansion and low-density in the left intrinsic neck muscles. Extensive dental caries and periapical erosions. Degenerative endplate spurring. Upper chest: Negative IMPRESSION: 1. Extensive myositis of left posterior intrinsic neck musculature associated with fluid collections along the deep fascial layer, likely cavitated nodes. Suppurative adenitis or tuberculous adenitis are leading considerations. Would not expect this degree of inflammation for cavitating tumor. 2.  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 In this location a posttraumatic cause is likely. Electronically Signed   By: Marnee Spring M.D.   On: 08/28/2020 05:18   DG Chest Portable 1 View  Result Date: 08/28/2020 CLINICAL DATA:  Abnormal CT. EXAM: PORTABLE CHEST 1 VIEW COMPARISON:  CT neck 9 08/2020 FINDINGS: Mediastinum and hilar structures normal. Heart size normal. Low lung volumes. Tiny calcified nodule left lung base suggesting tiny calcified granuloma. No focal infiltrate. No evidence of active TB. No pleural effusion or pneumothorax. No acute bony abnormality. IMPRESSION: Tiny calcified nodule left lung base suggesting tiny calcified granuloma. No focal infiltrate. No evidence of active TB. Electronically Signed   By: Maisie Fus  Register   On: 08/28/2020 05:53    EKG: None  Assessment/Plan Active Problems:   Cellulitis of neck   Neck abscess  (please populate well all problems here in Problem List. (For example, if patient is on BP meds at home and you resume or decide to hold them, it is a problem that needs to be her. Same for CAD, COPD, HLD and so on)  Left posterior neck abscess with cellulitis -Given her history of recurrent abscess formation and history of MRSA infection, continue coverage of vancomycin and Zosyn for now. -Physical exam and imaging study indicates no local cervical nerve involvement. -MRSA screening -Discussed with surgical attending, who plans to perform a incision and drainage tomorrow under general anesthesia. -Given the significant increase of white count and left shift, less concern about atypical infection as of now.  But will send QuantiFERON test.  Depends on culture from incision and drainage, will consider ID consultation.  Uncontrolled diabetes -Likely related to left neck infection, resume home insulin regimen plus standing dose of 8 unit aspart 3 times daily AC, and sliding scale.  DM neuropathy -On gabapentin  DVT  prophylaxis: Lovenox Code Status: Full Code Family Communication: None at bedside Disposition Plan: Expect more than 2 midnight hospital stay for I&D and culture. Consults called:Surgery Admission status: Med Surg   Emeline General MD [facility] Pager [phone]  08/28/2020, 4:06 PM

## 2020-08-28 NOTE — ED Notes (Signed)
In room to obtain blood cultures per MD orders; patient refused venous stick for collection of blood cultures. Advised to patient that we can not use his existing peripheral IV for collection of blood cultures. Patient continues to refuse peripheral stick for blood cultures. Dr Nicanor Alcon aware.

## 2020-08-28 NOTE — Progress Notes (Signed)
Pharmacy Antibiotic Note  Juan Roman is a 44 y.o. male admitted on 08/28/2020 with neck pain and swelling.  CT shows myositis, suppurative vs tuberculous adenitis, left ICA pseudoaneurysm.  Pharmacy has been consulted for vancomycin and Zosyn dosing for cellulitis.  Noted patient received first doses of antibiotics this AM.  Scr 0.63, CrCL 127 ml/min, afebrile, WBC 22.1.  Plan: Vanc 1500mg  IV Q12H for goal trough 10-15 mcg/mL Zosyn EID 3.375gm IV Q8H Monitor renal fxn, clinical progress, vanc trough as indicated  Height: 5\' 7"  (170.2 cm) Weight: 86.2 kg (190 lb) IBW/kg (Calculated) : 66.1  Temp (24hrs), Avg:99.5 F (37.5 C), Min:99.3 F (37.4 C), Max:99.6 F (37.6 C)  Recent Labs  Lab 08/28/20 0313  WBC 22.1*  CREATININE 0.63    Estimated Creatinine Clearance: 127.4 mL/min (by C-G formula based on SCr of 0.63 mg/dL).    Allergies  Allergen Reactions  . Influenza Virus Vac Live Quad Anaphylaxis  . Nsaids Nausea And Vomiting  . Toradol [Ketorolac Tromethamine]   . Tramadol     Vanc 9/9 >>  Zosyn 9/9 >>   9/9 MRSA PCR -  9/9 BCx -   Lenita Peregrina D. 11/9, PharmD, BCPS, BCCCP 08/28/2020, 2:16 PM

## 2020-08-29 ENCOUNTER — Encounter (HOSPITAL_COMMUNITY)
Admission: EM | Disposition: A | Discharge status: Home / Self Care | Payer: Self-pay | Source: Home / Self Care | Attending: Internal Medicine

## 2020-08-29 ENCOUNTER — Inpatient Hospital Stay (HOSPITAL_COMMUNITY): Payer: Medicaid Other | Admitting: Certified Registered"

## 2020-08-29 DIAGNOSIS — L0211 Cutaneous abscess of neck: Secondary | ICD-10-CM

## 2020-08-29 HISTORY — PX: INCISION AND DRAINAGE ABSCESS: SHX5864

## 2020-08-29 LAB — GLUCOSE, CAPILLARY
Glucose-Capillary: 262 mg/dL — ABNORMAL HIGH (ref 70–99)
Glucose-Capillary: 275 mg/dL — ABNORMAL HIGH (ref 70–99)
Glucose-Capillary: 276 mg/dL — ABNORMAL HIGH (ref 70–99)
Glucose-Capillary: 289 mg/dL — ABNORMAL HIGH (ref 70–99)
Glucose-Capillary: 362 mg/dL — ABNORMAL HIGH (ref 70–99)
Glucose-Capillary: 367 mg/dL — ABNORMAL HIGH (ref 70–99)
Glucose-Capillary: 447 mg/dL — ABNORMAL HIGH (ref 70–99)

## 2020-08-29 SURGERY — INCISION AND DRAINAGE, ABSCESS
Anesthesia: General | Site: Neck | Laterality: Left

## 2020-08-29 MED ORDER — FENTANYL CITRATE (PF) 250 MCG/5ML IJ SOLN
INTRAMUSCULAR | Status: AC
  Filled 2020-08-29: qty 5

## 2020-08-29 MED ORDER — 0.9 % SODIUM CHLORIDE (POUR BTL) OPTIME
TOPICAL | Status: DC | PRN
  Administered 2020-08-29: 1000 mL

## 2020-08-29 MED ORDER — ONDANSETRON HCL 4 MG/2ML IJ SOLN
INTRAMUSCULAR | Status: AC
  Filled 2020-08-29: qty 2

## 2020-08-29 MED ORDER — SUCCINYLCHOLINE CHLORIDE 200 MG/10ML IV SOSY
PREFILLED_SYRINGE | INTRAVENOUS | Status: AC
  Filled 2020-08-29: qty 10

## 2020-08-29 MED ORDER — SUCCINYLCHOLINE CHLORIDE 20 MG/ML IJ SOLN
INTRAMUSCULAR | Status: DC | PRN
  Administered 2020-08-29: 90 mg via INTRAVENOUS

## 2020-08-29 MED ORDER — ORAL CARE MOUTH RINSE: 15.0000 mL | Freq: Once | OROMUCOSAL | Status: AC

## 2020-08-29 MED ORDER — DEXMEDETOMIDINE (PRECEDEX) IN NS 20 MCG/5ML (4 MCG/ML) IV SYRINGE
PREFILLED_SYRINGE | INTRAVENOUS | Status: DC | PRN
  Administered 2020-08-29: 8 ug via INTRAVENOUS

## 2020-08-29 MED ORDER — CHLORHEXIDINE GLUCONATE 0.12 % MT SOLN: 15.0000 mL | Freq: Once | OROMUCOSAL | Status: AC

## 2020-08-29 MED ORDER — OXYCODONE HCL 5 MG/5ML PO SOLN: 5.0000 mg | Freq: Once | ORAL | Status: DC | PRN

## 2020-08-29 MED ORDER — PROPOFOL 10 MG/ML IV BOLUS
INTRAVENOUS | Status: DC | PRN
  Administered 2020-08-29: 200 mg via INTRAVENOUS

## 2020-08-29 MED ORDER — PROPOFOL 10 MG/ML IV BOLUS
INTRAVENOUS | Status: AC
  Filled 2020-08-29: qty 20

## 2020-08-29 MED ORDER — HYDROMORPHONE HCL 1 MG/ML IJ SOLN
0.2500 mg | INTRAMUSCULAR | Status: DC | PRN
  Administered 2020-08-29: 0.5 mg via INTRAVENOUS

## 2020-08-29 MED ORDER — DEXMEDETOMIDINE (PRECEDEX) IN NS 20 MCG/5ML (4 MCG/ML) IV SYRINGE
PREFILLED_SYRINGE | INTRAVENOUS | Status: AC
  Filled 2020-08-29: qty 5

## 2020-08-29 MED ORDER — FENTANYL CITRATE (PF) 100 MCG/2ML IJ SOLN
INTRAMUSCULAR | Status: DC | PRN
Start: 2020-08-29 — End: 2020-08-29
  Administered 2020-08-29: 50 ug via INTRAVENOUS
  Administered 2020-08-29: 100 ug via INTRAVENOUS

## 2020-08-29 MED ORDER — LIDOCAINE 2% (20 MG/ML) 5 ML SYRINGE
INTRAMUSCULAR | Status: DC | PRN
  Administered 2020-08-29: 60 mg via INTRAVENOUS

## 2020-08-29 MED ORDER — PROMETHAZINE HCL 25 MG/ML IJ SOLN
INTRAMUSCULAR | Status: AC
  Administered 2020-08-29: 12.5 mg via INTRAVENOUS
  Filled 2020-08-29: qty 1

## 2020-08-29 MED ORDER — MIDAZOLAM HCL 2 MG/2ML IJ SOLN
INTRAMUSCULAR | Status: AC
  Filled 2020-08-29: qty 2

## 2020-08-29 MED ORDER — CHLORHEXIDINE GLUCONATE 0.12 % MT SOLN
OROMUCOSAL | Status: AC
  Administered 2020-08-29: 15 mL via OROMUCOSAL
  Filled 2020-08-29: qty 15

## 2020-08-29 MED ORDER — IBUPROFEN 400 MG PO TABS
400.0000 mg | ORAL_TABLET | Freq: Three times a day (TID) | ORAL | Status: DC
  Filled 2020-08-29: qty 1

## 2020-08-29 MED ORDER — OXYCODONE HCL 5 MG PO TABS: 5.0000 mg | ORAL_TABLET | Freq: Once | ORAL | Status: DC | PRN

## 2020-08-29 MED ORDER — ROCURONIUM BROMIDE 10 MG/ML (PF) SYRINGE
PREFILLED_SYRINGE | INTRAVENOUS | Status: AC
  Filled 2020-08-29: qty 10

## 2020-08-29 MED ORDER — MIDAZOLAM HCL 2 MG/2ML IJ SOLN
INTRAMUSCULAR | Status: DC | PRN
  Administered 2020-08-29: 2 mg via INTRAVENOUS

## 2020-08-29 MED ORDER — SUGAMMADEX SODIUM 200 MG/2ML IV SOLN
INTRAVENOUS | Status: DC | PRN
  Administered 2020-08-29: 400 mg via INTRAVENOUS

## 2020-08-29 MED ORDER — PANTOPRAZOLE SODIUM 40 MG PO TBEC
40.0000 mg | DELAYED_RELEASE_TABLET | Freq: Every day | ORAL | Status: DC
  Filled 2020-08-29 (×3): qty 1

## 2020-08-29 MED ORDER — ROCURONIUM BROMIDE 10 MG/ML (PF) SYRINGE
PREFILLED_SYRINGE | INTRAVENOUS | Status: DC | PRN
  Administered 2020-08-29: 50 mg via INTRAVENOUS

## 2020-08-29 MED ORDER — DEXAMETHASONE SODIUM PHOSPHATE 10 MG/ML IJ SOLN
INTRAMUSCULAR | Status: AC
  Filled 2020-08-29: qty 1

## 2020-08-29 MED ORDER — CYCLOBENZAPRINE HCL 5 MG PO TABS
5.0000 mg | ORAL_TABLET | Freq: Three times a day (TID) | ORAL | Status: DC
  Administered 2020-08-29 – 2020-08-31 (×6): 5 mg via ORAL
  Filled 2020-08-29 (×6): qty 1

## 2020-08-29 MED ORDER — LIDOCAINE 2% (20 MG/ML) 5 ML SYRINGE
INTRAMUSCULAR | Status: AC
  Filled 2020-08-29: qty 5

## 2020-08-29 MED ORDER — HYDROMORPHONE HCL 1 MG/ML IJ SOLN
INTRAMUSCULAR | Status: AC
Start: 2020-08-29 — End: 2020-08-29
  Administered 2020-08-29: 0.5 mg via INTRAVENOUS
  Filled 2020-08-29: qty 1

## 2020-08-29 MED ORDER — PROMETHAZINE HCL 25 MG/ML IJ SOLN: 6.2500 mg | INTRAMUSCULAR | Status: DC | PRN

## 2020-08-29 MED ORDER — LACTATED RINGERS IV SOLN: INTRAVENOUS | Status: DC

## 2020-08-29 MED ORDER — OXYCODONE HCL 5 MG PO TABS
10.0000 mg | ORAL_TABLET | ORAL | Status: DC | PRN
  Administered 2020-08-29 – 2020-08-31 (×9): 10 mg via ORAL
  Filled 2020-08-29 (×10): qty 2

## 2020-08-29 SURGICAL SUPPLY — 30 items
BLADE CLIPPER SURG (BLADE) ×3 IMPLANT
BNDG GAUZE ELAST 4 BULKY (GAUZE/BANDAGES/DRESSINGS) IMPLANT
CANISTER SUCT 3000ML PPV (MISCELLANEOUS) ×3 IMPLANT
CHLORAPREP W/TINT 26 (MISCELLANEOUS) IMPLANT
CNTNR URN SCR LID CUP LEK RST (MISCELLANEOUS) ×1 IMPLANT
CONT SPEC 4OZ STRL OR WHT (MISCELLANEOUS) ×2
COVER SURGICAL LIGHT HANDLE (MISCELLANEOUS) ×3 IMPLANT
COVER WAND RF STERILE (DRAPES) IMPLANT
DRAPE LAPAROSCOPIC ABDOMINAL (DRAPES) IMPLANT
DRAPE LAPAROTOMY 100X72 PEDS (DRAPES) ×3 IMPLANT
DRSG PAD ABDOMINAL 8X10 ST (GAUZE/BANDAGES/DRESSINGS) IMPLANT
ELECT CAUTERY BLADE 6.4 (BLADE) IMPLANT
ELECT REM PT RETURN 9FT ADLT (ELECTROSURGICAL) ×3
ELECTRODE REM PT RTRN 9FT ADLT (ELECTROSURGICAL) ×1 IMPLANT
GAUZE PACKING IODOFORM 1/2 (PACKING) ×3 IMPLANT
GAUZE SPONGE 4X4 12PLY STRL (GAUZE/BANDAGES/DRESSINGS) ×3 IMPLANT
GLOVE BIO SURGEON STRL SZ7 (GLOVE) ×3 IMPLANT
GLOVE BIOGEL PI IND STRL 7.5 (GLOVE) ×1 IMPLANT
GLOVE BIOGEL PI INDICATOR 7.5 (GLOVE) ×2
GOWN STRL REUS W/ TWL LRG LVL3 (GOWN DISPOSABLE) ×2 IMPLANT
GOWN STRL REUS W/TWL LRG LVL3 (GOWN DISPOSABLE) ×4
KIT BASIN OR (CUSTOM PROCEDURE TRAY) ×3 IMPLANT
KIT TURNOVER KIT B (KITS) ×3 IMPLANT
NS IRRIG 1000ML POUR BTL (IV SOLUTION) ×3 IMPLANT
PACK GENERAL/GYN (CUSTOM PROCEDURE TRAY) ×3 IMPLANT
PAD ARMBOARD 7.5X6 YLW CONV (MISCELLANEOUS) ×3 IMPLANT
PENCIL SMOKE EVACUATOR (MISCELLANEOUS) ×3 IMPLANT
SWAB COLLECTION DEVICE MRSA (MISCELLANEOUS) ×3 IMPLANT
SWAB CULTURE ESWAB REG 1ML (MISCELLANEOUS) ×3 IMPLANT
TOWEL GREEN STERILE (TOWEL DISPOSABLE) ×3 IMPLANT

## 2020-08-29 NOTE — Op Note (Signed)
Preop diagnosis: Deep subfascial abscess left posterior neck Postop diagnosis: Same Procedure performed: Incision and drainage of left posterior neck abscess Surgeon:Aparna Vanderweele K Jasmane Brockway Anesthesia: General Indications: This is a 44 year old male who is a diabetic with poor control of his blood sugars.  He suffers frequent superficial skin abscess.  However he presented with a significant mount of deep neck pain and swelling.  He was found to have a subfascial abscess in the posterior neck.  He presents now for drainage.  Description of procedure: Patient is brought to the operating room and placed in the supine position on the operating room table.  After an adequate level of general anesthesia was obtained, he was moved to a lateral position with his right side down.  He was held in place with a beanbag and straps.  His posterior neck was prepped with DuraPrep and draped in sterile fashion.  A timeout was taken to ensure the proper patient and proper procedure.  I made a 3 cm transverse incision across the area of greatest induration.  We dissected down through the subcutaneous tissue until we reached the first layer of muscle.  With palpation I was able to express a moderate amount of purulent fluid from this area.  We open the muscle and found a 2 cm abscess cavity.  We debrided some of the abscess wall and sent this for pathologic examination.  The purulent drainage was cultured and sent for microbiology.  We irrigated the wound thoroughly.  The entire wound was packed with half-inch iodophor gauze.  Dry dressings applied.  The patient was moved back to a supine position.  He was extubated and brought to the recovery room in stable condition.  All sponge, instrument, and needle counts are correct.  Wilmon Arms. Corliss Skains, MD, [facility]  General/ Trauma Surgery   08/29/2020 10:43 AM

## 2020-08-29 NOTE — Progress Notes (Signed)
PROGRESS NOTE    Juan Roman  ZWC:[id number] DOB: [birth year] DOA: 08/28/2020 PCP: Patient, No Pcp Per    Brief Narrative:  Patient admitted to the hospital with the working diagnosis of left posterior neck abscess.   44 yo male with pmhx of T2DM, diabetic neuropathy and IV drug abuse, pior MRSA infections. Reported 7 days of pain and edema at the left neck region. On his initial physical examination BP 128/94, HR 94. RR 18, 02 97%. Left posterior neck with fullness, positive erythema, severe tenderness and increased local temperature. Lungs clear to auscultation, heart S1-S2 present, abdomen soft and no lower extremity edema.    Assessment & Plan:   Active Problems:   Cellulitis of neck   Neck abscess   Infective myositis   Lymphadenitis   1. Neck cellulitis. SP I&D, pain improved but continue to be severe, worse with movement, no nausea or vomiting.  Wbc is up to 22, will check blood cultures, patient is at high risk for bacteremia.   Continue antibiotic therapy with IV Zosyn and Vancomycin for sever purulent cellulitis/ abscess.   Continue pain control with oxycodone, will increase to 10 mg and will add flexeril, change acetaminophen to scheduled and add ibuprofen with Gi prophylaxis.   2. T2DM uncontrolled. Continue glucose control with basal insulin 30 units qhs and insulin sliding scale, pre-meal insulin 6 units.   Capillary glucose 276, 289 and 367  3. Diabetic neuropathy. Will continue gabapentin.     Patient continue to be at high risk for worsening abscess and cellulitis   Status is: Inpatient  Remains inpatient appropriate because:IV treatments appropriate due to intensity of illness or inability to take PO   Dispo: The patient is from: Home              Anticipated d/c is to: Home              Anticipated d/c date is: 2 days              Patient currently is not medically stable to d/c.   DVT prophylaxis: Enoxaparin   Code Status:   full  Family  Communication:  No family at the bedside       Consultants:   Surgery   Procedures:   Neck abscess I&D  Antimicrobials:   Vancomycin and zosyn     Subjective: Patient continue to have neck pain, worse with movement and to touch, with no radiation, no associated nausea or vomiting.,   Objective: Vitals:   08/29/20 1100 08/29/20 1110 08/29/20 1145 08/29/20 1326  BP: 128/84 (!) 132/92 137/90 119/72  Pulse: 94  94 93  Resp: 19  18 16   Temp:  98.4 F (36.9 C) 97.7 F (36.5 C) 98.3 F (36.8 C)  TempSrc:   Oral Oral  SpO2: 100%  99% 98%  Weight:      Height:        Intake/Output Summary (Last 24 hours) at 08/29/2020 1631 Last data filed at 08/29/2020 1400 Gross per 24 hour  Intake 1260 ml  Output 5 ml  Net 1255 ml   Filed Weights   08/28/20 0253  Weight: 86.2 kg    Examination:   General: Not in pain or dyspnea. Deconditioned  Neurology: Awake and alert, non focal  E ENT: no pallor, no icterus, oral mucosa moist Cardiovascular: No JVD. S1-S2 present, rhythmic, no gallops, rubs, or murmurs. No lower extremity edema. Pulmonary: vesicular breath sounds bilaterally, adequate air movement, no wheezing, rhonchi or  rales. Gastrointestinal. Abdomen soft and non tender Skin. Wound dressing in place at the base of the left neck.  Musculoskeletal: no joint deformities     Data Reviewed: I have personally reviewed following labs and imaging studies  CBC: Recent Labs  Lab 08/28/20 0313  WBC 22.1*  NEUTROABS 15.7*  HGB 14.5  HCT 43.1  MCV 84.3  PLT 298   Basic Metabolic Panel: Recent Labs  Lab 08/28/20 0313  NA 130*  K 4.2  CL 99  CO2 22  GLUCOSE 553*  BUN 11  CREATININE 0.63  CALCIUM 8.9   GFR: Estimated Creatinine Clearance: 127.4 mL/min (by C-G formula based on SCr of 0.63 mg/dL). Liver Function Tests: No results for input(s): AST, ALT, ALKPHOS, BILITOT, PROT, ALBUMIN in the last 168 hours. No results for input(s): LIPASE, AMYLASE in the last  168 hours. No results for input(s): AMMONIA in the last 168 hours. Coagulation Profile: No results for input(s): INR, PROTIME in the last 168 hours. Cardiac Enzymes: Recent Labs  Lab 08/28/20 1517  CKTOTAL 31*   BNP (last 3 results) No results for input(s): PROBNP in the last 8760 hours. HbA1C: Recent Labs    08/28/20 1517  HGBA1C 13.3*   CBG: Recent Labs  Lab 08/28/20 2111 08/29/20 0750 08/29/20 0907 08/29/20 1046 08/29/20 1145  GLUCAP 328* 275* 262* 276* 289*   Lipid Profile: No results for input(s): CHOL, HDL, LDLCALC, TRIG, CHOLHDL, LDLDIRECT in the last 72 hours. Thyroid Function Tests: No results for input(s): TSH, T4TOTAL, FREET4, T3FREE, THYROIDAB in the last 72 hours. Anemia Panel: No results for input(s): VITAMINB12, FOLATE, FERRITIN, TIBC, IRON, RETICCTPCT in the last 72 hours.    Radiology Studies: I have reviewed all of the imaging during this hospital visit personally     Scheduled Meds: . acetaminophen  1,000 mg Oral Q8H  . enoxaparin (LOVENOX) injection  40 mg Subcutaneous QHS  . gabapentin  400 mg Oral TID  . insulin aspart  0-15 Units Subcutaneous TID WC  . insulin aspart  0-5 Units Subcutaneous QHS  . insulin aspart  6 Units Subcutaneous TID WC  . insulin glargine  30 Units Subcutaneous QHS  . melatonin  5 mg Oral QHS   Continuous Infusions: . piperacillin-tazobactam (ZOSYN)  IV 3.375 g (08/29/20 1352)  . vancomycin 1,500 mg (08/29/20 0524)     LOS: 1 day        Merly Hinkson Annett Gula, MD

## 2020-08-29 NOTE — Anesthesia Preprocedure Evaluation (Addendum)
Anesthesia Evaluation  Patient identified by MRN, date of birth, ID band Patient awake    Reviewed: Allergy & Precautions, NPO status , Patient's Chart, lab work & pertinent test results  Airway Mallampati: III  TM Distance: >3 FB Neck ROM: Full  Mouth opening: Limited Mouth Opening Comment: limited mouth opening 2/2 TMJ, limited neck extension 2/2 posterior neck abscess Dental  (+) Poor Dentition, Dental Advisory Given Poor dentition, chipped throughout. Nothing loose per patient :   Pulmonary Current Smoker and Patient abstained from smoking.,  1/2ppd, no inhalers    Pulmonary exam normal breath sounds clear to auscultation       Cardiovascular negative cardio ROS Normal cardiovascular exam Rhythm:Regular Rate:Normal     Neuro/Psych Diabetic peripheral neuropathy  Neuromuscular disease negative psych ROS   GI/Hepatic negative GI ROS, (+)     substance abuse (last marijuana a few days ago)  marijuana use,   Endo/Other  diabetes, Poorly Controlled, Type 1, Insulin Dependenta1c 13 Diabetic neuropathy  Renal/GU negative Renal ROS  negative genitourinary   Musculoskeletal  (+) Arthritis , Disabled, lumbar degenerative issues   Abdominal   Peds negative pediatric ROS (+)  Hematology negative hematology ROS (+) hct 43.1   Anesthesia Other Findings Posterior neck abscess  Reproductive/Obstetrics negative OB ROS                           Anesthesia Physical Anesthesia Plan  ASA: III  Anesthesia Plan: General   Post-op Pain Management:    Induction: Intravenous  PONV Risk Score and Plan: 1 and Ondansetron, Midazolam and Treatment may vary due to age or medical condition  Airway Management Planned: Oral ETT  Additional Equipment: None  Intra-op Plan:   Post-operative Plan: Extubation in OR  Informed Consent: I have reviewed the patients History and Physical, chart, labs and  discussed the procedure including the risks, benefits and alternatives for the proposed anesthesia with the patient or authorized representative who has indicated his/her understanding and acceptance.     Dental advisory given  Plan Discussed with: CRNA  Anesthesia Plan Comments:         Anesthesia Quick Evaluation

## 2020-08-29 NOTE — Progress Notes (Signed)
Patient ID: Juan Roman, male   DOB: 1976-02-14, 44 y.o.   MRN: [id number] Still with posterior left neck tenderness  Plan I&D today under general anesthesia.  The surgical procedure has been discussed with the patient.  Potential risks, benefits, alternative treatments, and expected outcomes have been explained.  All of the patient's questions at this time have been answered.  The likelihood of reaching the patient's treatment goal is good.  The patient understand the proposed surgical procedure and wishes to proceed.   Wilmon Arms. Corliss Skains, MD, [facility] Surgery  General/ Trauma Surgery   08/29/2020 9:37 AM

## 2020-08-29 NOTE — Anesthesia Postprocedure Evaluation (Signed)
Anesthesia Post Note  Patient: Juan Roman  Procedure(s) Performed: INCISION AND DRAINAGE  OF POSTERIOR NECK ABSCESS (Left Neck)     Patient location during evaluation: PACU Anesthesia Type: General Level of consciousness: awake and alert, oriented and patient cooperative Pain management: pain level controlled Vital Signs Assessment: post-procedure vital signs reviewed and stable Respiratory status: spontaneous breathing, nonlabored ventilation and respiratory function stable Cardiovascular status: blood pressure returned to baseline and stable Postop Assessment: no apparent nausea or vomiting Anesthetic complications: no   No complications documented.  Last Vitals:  Vitals:   08/29/20 1110 08/29/20 1145  BP: (!) 132/92 137/90  Pulse:  94  Resp:  18  Temp: 36.9 C   SpO2:  99%    Last Pain:  Vitals:   08/29/20 1110  TempSrc:   PainSc: Asleep                 Lannie Fields

## 2020-08-29 NOTE — Transfer of Care (Signed)
Immediate Anesthesia Transfer of Care Note  Patient: Juan Roman  Procedure(s) Performed: INCISION AND DRAINAGE  OF POSTERIOR NECK ABSCESS (Left Neck)  Patient Location: PACU  Anesthesia Type:General  Level of Consciousness: awake and confused  Airway & Oxygen Therapy: Patient Spontanous Breathing  Post-op Assessment: Report given to RN  Post vital signs: Reviewed and stable  Last Vitals:  Vitals Value Taken Time  BP    Temp    Pulse 105 08/29/20 1047  Resp 23 08/29/20 1047  SpO2 96 % 08/29/20 1047  Vitals shown include unvalidated device data.  Last Pain:  Vitals:   08/29/20 0906  TempSrc:   PainSc: 8       Patients Stated Pain Goal: 2 (08/29/20 0906)  Complications: No complications documented.

## 2020-08-29 NOTE — Plan of Care (Signed)
  Problem: Education: Goal: Knowledge of General Education information will improve Description Including pain rating scale, medication(s)/side effects and non-pharmacologic comfort measures Outcome: Progressing   

## 2020-08-29 NOTE — Progress Notes (Addendum)
Inpatient Diabetes Program Recommendations  AACE/ADA: New Consensus Statement on Inpatient Glycemic Control (2015)  Target Ranges:  Prepandial:   less than 140 mg/dL      Peak postprandial:   less than 180 mg/dL (1-2 hours)      Critically ill patients:  140 - 180 mg/dL   Lab Results  Component Value Date   GLUCAP 262 (H) 08/29/2020   HGBA1C 13.3 (H) 08/28/2020    Review of Glycemic Control  Diabetes history: DM 2 Outpatient Diabetes medications: Lantus 38 units, Humulin R 3 units tid Current orders for Inpatient glycemic control:  Lantus 30 units Novolog 0-15 units tid + hs Novolog 6 units tid meal coverage  A1c 13.3% this admission  Inpatient Diabetes Program Recommendations:    -  Increase Lantus to 40 units  Will see after surgery today to discuss A1c level. Noted out of state medicaid [place]. Gets meds at [facility] in [place].  1 PM:  Briefly spoke with pt at bedside. When I introduced myself pt looked away and said he was not going to talk about that. I tried to say I wanted to make sure he had his insulin at home. Pt said "I won't take it anyway." Could not get much information. Pt did mention his A1c of 13% was not that bad. Pt also reported our topic of conversation was "prying into his personal life and he was not going to talk about it."  Pt was more inclined to speak with me when I asked he if needed anything from me or nursing staff when he was inquiring about an increase dosage of pain medication.  Thanks, Christena Deem RN, MSN, BC-ADM Inpatient Diabetes Coordinator Team Pager [phone] (8a-5p)

## 2020-08-29 NOTE — Progress Notes (Signed)
Patient being taken down for procedure with transport.

## 2020-08-29 NOTE — Anesthesia Procedure Notes (Signed)
Procedure Name: Intubation Date/Time: 08/29/2020 10:05 AM Performed by: Barrington Ellison, CRNA Pre-anesthesia Checklist: Patient identified, Emergency Drugs available, Suction available and Patient being monitored Patient Re-evaluated:Patient Re-evaluated prior to induction Oxygen Delivery Method: Circle System Utilized Preoxygenation: Pre-oxygenation with 100% oxygen Induction Type: IV induction Ventilation: Mask ventilation without difficulty Laryngoscope Size: Mac and 3 Grade View: Grade I Tube type: Oral Tube size: 7.5 mm Number of attempts: 1 Airway Equipment and Method: Stylet and Oral airway Placement Confirmation: ETT inserted through vocal cords under direct vision,  positive ETCO2 and breath sounds checked- equal and bilateral Secured at: 22 cm Tube secured with: Tape Dental Injury: Teeth and Oropharynx as per pre-operative assessment

## 2020-08-30 ENCOUNTER — Encounter (HOSPITAL_COMMUNITY): Payer: Self-pay | Admitting: Surgery

## 2020-08-30 DIAGNOSIS — E1169 Type 2 diabetes mellitus with other specified complication: Secondary | ICD-10-CM

## 2020-08-30 DIAGNOSIS — Z794 Long term (current) use of insulin: Secondary | ICD-10-CM

## 2020-08-30 DIAGNOSIS — E119 Type 2 diabetes mellitus without complications: Secondary | ICD-10-CM

## 2020-08-30 LAB — BASIC METABOLIC PANEL
Anion gap: 11 (ref 5–15)
BUN: 7 mg/dL (ref 6–20)
CO2: 23 mmol/L (ref 22–32)
Calcium: 9 mg/dL (ref 8.9–10.3)
Chloride: 99 mmol/L (ref 98–111)
Creatinine, Ser: 0.65 mg/dL (ref 0.61–1.24)
GFR calc Af Amer: >60 mL/min (ref >60)
GFR calc non Af Amer: >60 mL/min (ref >60)
Glucose, Bld: 359 mg/dL — ABNORMAL HIGH (ref 70–99)
Potassium: 3.9 mmol/L (ref 3.5–5.1)
Sodium: 133 mmol/L — ABNORMAL LOW (ref 135–145)

## 2020-08-30 LAB — QUANTIFERON-TB GOLD PLUS: QuantiFERON-TB Gold Plus: NEGATIVE

## 2020-08-30 LAB — QUANTIFERON-TB GOLD PLUS (RQFGPL)
QuantiFERON Mitogen Value: 6.19 IU/mL
QuantiFERON Nil Value: 0.12 IU/mL
QuantiFERON TB1 Ag Value: 0.09 IU/mL
QuantiFERON TB2 Ag Value: 0.13 IU/mL

## 2020-08-30 LAB — CBC
HCT: 44.5 % (ref 39.0–52.0)
Hemoglobin: 14.1 g/dL (ref 13.0–17.0)
MCH: 27 pg (ref 26.0–34.0)
MCHC: 31.7 g/dL (ref 30.0–36.0)
MCV: 85.2 fL (ref 80.0–100.0)
Platelets: 253 10*3/uL (ref 150–400)
RBC: 5.22 MIL/uL (ref 4.22–5.81)
RDW: 13.9 % (ref 11.5–15.5)
WBC: 10.4 10*3/uL (ref 4.0–10.5)
nRBC: 0 % (ref 0.0–0.2)

## 2020-08-30 LAB — GLUCOSE, CAPILLARY
Glucose-Capillary: 388 mg/dL — ABNORMAL HIGH (ref 70–99)
Glucose-Capillary: 382 mg/dL — ABNORMAL HIGH (ref 70–99)
Glucose-Capillary: 360 mg/dL — ABNORMAL HIGH (ref 70–99)
Glucose-Capillary: 226 mg/dL — ABNORMAL HIGH (ref 70–99)

## 2020-08-30 MED ORDER — INSULIN GLARGINE 100 UNIT/ML ~~LOC~~ SOLN
40.0000 [IU] | Freq: Every day | SUBCUTANEOUS | Status: DC
  Administered 2020-08-30: 40 [IU] via SUBCUTANEOUS
  Filled 2020-08-30 (×2): qty 0.4

## 2020-08-30 MED ORDER — CLINDAMYCIN HCL 300 MG PO CAPS
300.0000 mg | ORAL_CAPSULE | Freq: Three times a day (TID) | ORAL | Status: DC
  Administered 2020-08-30 – 2020-08-31 (×4): 300 mg via ORAL
  Filled 2020-08-30 (×4): qty 1

## 2020-08-30 NOTE — Progress Notes (Signed)
PROGRESS NOTE    Juan Roman  LNL:[id number] DOB: [birth year] DOA: 08/28/2020 PCP: Patient, No Pcp Per    Brief Narrative:  Patient admitted to the hospital with the working diagnosis of left posterior neck abscess.   44 yo male with pmhx of T2DM, diabetic neuropathy and IV drug abuse, pior MRSA infections. Reported 7 days of pain and edema at the left neck region. On his initial physical examination BP 128/94, HR 94. RR 18, 02 97%. Left posterior neck with fullness, positive erythema, severe tenderness and increased local temperature. Lungs clear to auscultation, heart S1-S2 present, abdomen soft and no lower extremity edema.  Sodium 130, potassium 4.2, chloride 99, bicarb 22, glucose 553, BUN 11, creatinine 0.63, white count 22.1, hemoglobin 14.5, hematocrit 43.1, platelets 299.  SARS COVID-19 negative.  Urinalysis specific gravity less than 1.005, glucose more than 500 Soft tissue neck CT scan with extensive myositis of the left posterior intrinsic neck musculature associated with fluid collections along the deep fascial layer likely cavitated nodes.  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  Chest radiograph no infiltrates.  Assessment & Plan:   Principal Problem:   Neck abscess Active Problems:   Cellulitis of neck   Infective myositis   Lymphadenitis   T2DM (type 2 diabetes mellitus) (HCC)   1. Neck cellulitis. SP I&D. Wbc is down to 10. Cultures  Wound cultures with rare group A strep, pyogenes. Blood cultures with no growth.   Will continue with Zosyn and will add Clindamycin, hold on Vancomycin for now. Continue to follow up on cell count and final culture sensitivities.   Continue pain control with acetaminophen, ibuprofen, oxycodone, morphine and flexeril. Patient has been out of bed and ambulating.   2. T2DM uncontrolled. Positive hyperglycemia with glucose 359 this am, will continue with insulin siding scale, increase basal insulin to 40 units.  Patient at  home on lantus and humalog. Will need assistance to get medications at outpatient.    3. Diabetic neuropathy. On gabapentin.     Status is: Inpatient  Remains inpatient appropriate because:IV treatments appropriate due to intensity of illness or inability to take PO   Dispo: The patient is from: Home              Anticipated d/c is to: Home              Anticipated d/c date is: 1 day              Patient currently is not medically stable to d/c.   DVT prophylaxis: Enoxaparin   Code Status:   full  Family Communication:  No family at the bedside       Consultants:   Surgery   Procedures:   I&D  Antimicrobials:   Vancomycin and zosyn     Subjective: Patient with persistent neck pain but improved in intensity, no nausea or vomiting, and tolerating po well. He has been out of bed and ambulating, declined sq enoxaparin   Objective: Vitals:   08/29/20 1811 08/29/20 2145 08/30/20 0217 08/30/20 0632  BP: 128/82 (!) 145/85 (!) 130/93 116/87  Pulse: 97 96 98 87  Resp: 16 16 18 18   Temp: 98.7 F (37.1 C) 98.8 F (37.1 C) 98.2 F (36.8 C) (!) 97.4 F (36.3 C)  TempSrc: Oral Oral Oral Oral  SpO2: 95% 97% 96% 100%  Weight:      Height:        Intake/Output Summary (Last 24 hours) at 08/30/2020 1358 Last  data filed at 08/30/2020 0809 Gross per 24 hour  Intake 1510 ml  Output --  Net 1510 ml   Filed Weights   08/28/20 0253  Weight: 86.2 kg    Examination:   General: Not in pain or dyspnea,  Neurology: Awake and alert, non focal  E ENT: no pallor, no icterus, oral mucosa moist Cardiovascular: No JVD. S1-S2 present, rhythmic, no gallops, rubs, or murmurs. No lower extremity edema. Pulmonary:  positive breath sounds bilaterally,  Gastrointestinal. Abdomen soft and non tender Skin.left base of the neck surgical wound with dressing in place, reduced erythema and edema. Continue to be tender to palpation.,  Musculoskeletal: no joint deformities     Data  Reviewed: I have personally reviewed following labs and imaging studies  CBC: Recent Labs  Lab 08/28/20 0313 08/30/20 0746  WBC 22.1* 10.4  NEUTROABS 15.7*  --   HGB 14.5 14.1  HCT 43.1 44.5  MCV 84.3 85.2  PLT 298 253   Basic Metabolic Panel: Recent Labs  Lab 08/28/20 0313 08/30/20 0746  NA 130* 133*  K 4.2 3.9  CL 99 99  CO2 22 23  GLUCOSE 553* 359*  BUN 11 7  CREATININE 0.63 0.65  CALCIUM 8.9 9.0   GFR: Estimated Creatinine Clearance: 127.4 mL/min (by C-G formula based on SCr of 0.65 mg/dL). Liver Function Tests: No results for input(s): AST, ALT, ALKPHOS, BILITOT, PROT, ALBUMIN in the last 168 hours. No results for input(s): LIPASE, AMYLASE in the last 168 hours. No results for input(s): AMMONIA in the last 168 hours. Coagulation Profile: No results for input(s): INR, PROTIME in the last 168 hours. Cardiac Enzymes: Recent Labs  Lab 08/28/20 1517  CKTOTAL 31*   BNP (last 3 results) No results for input(s): PROBNP in the last 8760 hours. HbA1C: Recent Labs    08/28/20 1517  HGBA1C 13.3*   CBG: Recent Labs  Lab 08/29/20 1631 08/29/20 2143 08/29/20 2336 08/30/20 0804 08/30/20 1210  GLUCAP 367* 447* 362* 388* 382*   Lipid Profile: No results for input(s): CHOL, HDL, LDLCALC, TRIG, CHOLHDL, LDLDIRECT in the last 72 hours. Thyroid Function Tests: No results for input(s): TSH, T4TOTAL, FREET4, T3FREE, THYROIDAB in the last 72 hours. Anemia Panel: No results for input(s): VITAMINB12, FOLATE, FERRITIN, TIBC, IRON, RETICCTPCT in the last 72 hours.    Radiology Studies: I have reviewed all of the imaging during this hospital visit personally     Scheduled Meds:  acetaminophen  1,000 mg Oral Q8H   cyclobenzaprine  5 mg Oral TID   gabapentin  400 mg Oral TID   insulin aspart  0-15 Units Subcutaneous TID WC   insulin aspart  0-5 Units Subcutaneous QHS   insulin aspart  6 Units Subcutaneous TID WC   insulin glargine  40 Units Subcutaneous  QHS   melatonin  5 mg Oral QHS   pantoprazole  40 mg Oral Daily   Continuous Infusions:  piperacillin-tazobactam (ZOSYN)  IV 3.375 g (08/30/20 7341)   vancomycin 1,500 mg (08/30/20 0346)     LOS: 2 days        Jennie Bolar Annett Gula, MD

## 2020-08-30 NOTE — Progress Notes (Signed)
[facility] Progress Note  1 Day Post-Op  Subjective: Some pain in neck but doing ok overall. Blood sugars still in the 300s. Patient reports his brother will likely be able to help with dressing changes.   Objective: Vital signs in last 24 hours: Temp:  [97.4 F (36.3 C)-98.8 F (37.1 C)] 97.4 F (36.3 C) (09/11 5697) Pulse Rate:  [87-101] 87 (09/11 0632) Resp:  [16-19] 18 (09/11 9480) BP: (116-145)/(72-113) 116/87 (09/11 1655) SpO2:  [95 %-100 %] 100 % (09/11 3748) Last BM Date: 08/29/20  Intake/Output from previous day: 09/10 0701 - 09/11 0700 In: 1740 [P.O.:1440; I.V.:300] Out: 5 [Blood:5] Intake/Output this shift: Total I/O In: 550 [P.O.:550] Out: -   PE: General: pleasant, WD, WN white male who is laying in bed in NAD Neck: posterior incision 5 cm in length and 1.5 cm deep, packing removed with small amount bleeding, no purulence, still has some surrounding erythema but induration improving  Heart: regular, rate, and rhythm.  Lungs:  Respiratory effort nonlabored Psych: A&Ox3 with an appropriate affect.   Lab Results:  Recent Labs    08/28/20 0313  WBC 22.1*  HGB 14.5  HCT 43.1  PLT 298   BMET Recent Labs    08/28/20 0313  NA 130*  K 4.2  CL 99  CO2 22  GLUCOSE 553*  BUN 11  CREATININE 0.63  CALCIUM 8.9   PT/INR No results for input(s): LABPROT, INR in the last 72 hours. CMP     Component Value Date/Time   NA 130 (L) 08/28/2020 0313   K 4.2 08/28/2020 0313   CL 99 08/28/2020 0313   CO2 22 08/28/2020 0313   GLUCOSE 553 (HH) 08/28/2020 0313   BUN 11 08/28/2020 0313   CREATININE 0.63 08/28/2020 0313   CALCIUM 8.9 08/28/2020 0313   GFRNONAA >60 08/28/2020 0313   GFRAA >60 08/28/2020 0313   Lipase  No results found for: LIPASE     Studies/Results: No results found.  Anti-infectives: Anti-infectives (From admission, onward)   Start     Dose/Rate Route Frequency Ordered Stop   08/28/20 1600  vancomycin (VANCOREADY) IVPB  1500 mg/300 mL        1,500 mg 150 mL/hr over 120 Minutes Intravenous Every 12 hours 08/28/20 1417     08/28/20 1500  piperacillin-tazobactam (ZOSYN) IVPB 3.375 g        3.375 g 12.5 mL/hr over 240 Minutes Intravenous Every 8 hours 08/28/20 1417     08/28/20 0430  vancomycin (VANCOCIN) IVPB 1000 mg/200 mL premix        1,000 mg 200 mL/hr over 60 Minutes Intravenous  Once 08/28/20 0424 08/28/20 0632   08/28/20 0430  piperacillin-tazobactam (ZOSYN) IVPB 3.375 g        3.375 g 100 mL/hr over 30 Minutes Intravenous  Once 08/28/20 0424 08/28/20 0744       Assessment/Plan Uncontrolled diabetes mellitus - per primary team   Posterior neck abscess and cellulitis  S/P I&D 08/29/20 Dr. Corliss Skains - WBC 10, afebrile - cxs pending, continue abx - would recommend 10 days PO abx on dc - BID dressing changes - saline wet to dry - TOC consult for medication assistance   FEN: CM diet VTE: lovenox ID: [email or web address] 9/9>>  LOS: 2 days    Juliet Rude , [facility] 08/30/2020, 8:31 AM Please see Amion for pager number during day hours 7:00am-4:30pm

## 2020-08-30 NOTE — Discharge Instructions (Signed)
WOUND CARE: - dressing to be changed twice daily - supplies: sterile saline, 2x2 gauze, scissors, 4x4 guaze, rolled gauze - remove dressing and all packing carefully, moistening with sterile saline as needed to avoid packing/internal dressing sticking to the wound. - clean edges of skin around the wound with water/gauze, making sure there is no tape debris or leakage left on skin that could cause skin irritation or breakdown. - dampen and clean 2x2 guaze with sterile saline and pack wound from wound base to skin level, making sure to take note of any possible areas of wound tracking, tunneling and packing appropriately. Wound can be packed loosely. Trim to size if a whole guaze is not required. - cover wound with 4x4 gauze and secure with rolled gauze wrapped around.  - write the date/time on the dry dressing/tape to better track when the last dressing change occurred. - apply any skin protectant/powder recommended by clinician to protect skin/skin folds. - change dressing as needed if leakage occurs, wound gets contaminated, or patient requests to shower. - patient may shower daily with wound open and following the shower the wound should be dried and a clean dressing placed.

## 2020-08-30 NOTE — Plan of Care (Signed)
  Problem: Education: Goal: Knowledge of General Education information will improve Description Including pain rating scale, medication(s)/side effects and non-pharmacologic comfort measures Outcome: Progressing   Problem: Education: Goal: Required Educational Video(s) Outcome: Progressing   Problem: Clinical Measurements: Goal: Postoperative complications will be avoided or minimized Outcome: Progressing   Problem: Skin Integrity: Goal: Demonstration of wound healing without infection will improve Outcome: Progressing   

## 2020-08-31 DIAGNOSIS — M6008 Infective myositis, other site: Principal | ICD-10-CM

## 2020-08-31 LAB — CBC WITH DIFFERENTIAL/PLATELET
Abs Immature Granulocytes: 0.34 10*3/uL — ABNORMAL HIGH (ref 0.00–0.07)
Basophils Absolute: 0.1 10*3/uL (ref 0.0–0.1)
Basophils Relative: 1 %
Eosinophils Absolute: 0.3 10*3/uL (ref 0.0–0.5)
Eosinophils Relative: 3 %
HCT: 42.9 % (ref 39.0–52.0)
Hemoglobin: 13.6 g/dL (ref 13.0–17.0)
Immature Granulocytes: 3 %
Lymphocytes Relative: 32 %
Lymphs Abs: 3.3 10*3/uL (ref 0.7–4.0)
MCH: 27.2 pg (ref 26.0–34.0)
MCHC: 31.7 g/dL (ref 30.0–36.0)
MCV: 85.8 fL (ref 80.0–100.0)
Monocytes Absolute: 0.6 10*3/uL (ref 0.1–1.0)
Monocytes Relative: 6 %
Neutro Abs: 5.5 10*3/uL (ref 1.7–7.7)
Neutrophils Relative %: 55 %
Platelets: 277 10*3/uL (ref 150–400)
RBC: 5 MIL/uL (ref 4.22–5.81)
RDW: 14.2 % (ref 11.5–15.5)
WBC: 10.1 10*3/uL (ref 4.0–10.5)
nRBC: 0 % (ref 0.0–0.2)

## 2020-08-31 LAB — BASIC METABOLIC PANEL
Anion gap: 8 (ref 5–15)
BUN: 10 mg/dL (ref 6–20)
CO2: 26 mmol/L (ref 22–32)
Calcium: 9 mg/dL (ref 8.9–10.3)
Chloride: 97 mmol/L — ABNORMAL LOW (ref 98–111)
Creatinine, Ser: 0.73 mg/dL (ref 0.61–1.24)
GFR calc Af Amer: >60 mL/min (ref >60)
GFR calc non Af Amer: >60 mL/min (ref >60)
Glucose, Bld: 434 mg/dL — ABNORMAL HIGH (ref 70–99)
Potassium: 4.4 mmol/L (ref 3.5–5.1)
Sodium: 131 mmol/L — ABNORMAL LOW (ref 135–145)

## 2020-08-31 LAB — GLUCOSE, CAPILLARY
Glucose-Capillary: 387 mg/dL — ABNORMAL HIGH (ref 70–99)
Glucose-Capillary: 412 mg/dL — ABNORMAL HIGH (ref 70–99)
Glucose-Capillary: 354 mg/dL — ABNORMAL HIGH (ref 70–99)

## 2020-08-31 MED ORDER — AMOXICILLIN 500 MG PO CAPS
500.0000 mg | ORAL_CAPSULE | Freq: Three times a day (TID) | ORAL | Status: DC
  Administered 2020-08-31: 500 mg via ORAL
  Filled 2020-08-31 (×3): qty 1

## 2020-08-31 MED ORDER — INSULIN SYRINGES (DISPOSABLE) U-100 0.3 ML MISC: 0 refills | Status: AC

## 2020-08-31 MED ORDER — BLOOD GLUCOSE METER KIT: PACK | 0 refills | Status: AC

## 2020-08-31 MED ORDER — CEPHALEXIN 500 MG PO CAPS: 500.0000 mg | ORAL_CAPSULE | Freq: Three times a day (TID) | ORAL | Status: DC

## 2020-08-31 MED ORDER — AMOXICILLIN-POT CLAVULANATE 875-125 MG PO TABS: 1.0000 | ORAL_TABLET | Freq: Two times a day (BID) | ORAL | Status: DC

## 2020-08-31 MED ORDER — ACETAMINOPHEN 500 MG PO TABS: 500.0000 mg | ORAL_TABLET | Freq: Four times a day (QID) | ORAL | 0 refills | Status: DC | PRN

## 2020-08-31 MED ORDER — OXYCODONE HCL 5 MG PO TABS: 5.0000 mg | ORAL_TABLET | Freq: Four times a day (QID) | ORAL | 0 refills | Status: DC | PRN

## 2020-08-31 MED ORDER — OXYCODONE HCL 5 MG PO TABS
5.0000 mg | ORAL_TABLET | ORAL | Status: DC | PRN
  Administered 2020-08-31 (×2): 5 mg via ORAL
  Filled 2020-08-31: qty 1

## 2020-08-31 MED ORDER — INSULIN ASPART PROT & ASPART (70-30 MIX) 100 UNIT/ML ~~LOC~~ SUSP
30.0000 [IU] | Freq: Two times a day (BID) | SUBCUTANEOUS | Status: DC
  Filled 2020-08-31: qty 10

## 2020-08-31 MED ORDER — IBUPROFEN 400 MG PO TABS: 400.0000 mg | ORAL_TABLET | Freq: Four times a day (QID) | ORAL | Status: DC | PRN

## 2020-08-31 MED ORDER — INSULIN ASPART PROT & ASPART (70-30 MIX) 100 UNIT/ML ~~LOC~~ SUSP: 30.0000 [IU] | Freq: Two times a day (BID) | SUBCUTANEOUS | 0 refills | Status: DC

## 2020-08-31 MED ORDER — IBUPROFEN 400 MG PO TABS: 400.0000 mg | ORAL_TABLET | Freq: Four times a day (QID) | ORAL | 0 refills | Status: DC | PRN

## 2020-08-31 MED ORDER — "NEEDLE (DISP) 14G X 1"" MISC": 0 refills | Status: AC

## 2020-08-31 MED ORDER — AMOXICILLIN 500 MG PO CAPS: 500.0000 mg | ORAL_CAPSULE | Freq: Three times a day (TID) | ORAL | 0 refills | Status: AC

## 2020-08-31 NOTE — Plan of Care (Signed)
  Problem: Education: Goal: Knowledge of General Education information will improve Description: Including pain rating scale, medication(s)/side effects and non-pharmacologic comfort measures Outcome: Adequate for Discharge   Problem: Education: Goal: Knowledge of General Education information will improve Description: Including pain rating scale, medication(s)/side effects and non-pharmacologic comfort measures Outcome: Adequate for Discharge   Problem: Health Behavior/Discharge Planning: Goal: Ability to manage health-related needs will improve Outcome: Adequate for Discharge   Problem: Clinical Measurements: Goal: Ability to maintain clinical measurements within normal limits will improve Outcome: Adequate for Discharge Goal: Will remain free from infection Outcome: Adequate for Discharge Goal: Diagnostic test results will improve Outcome: Adequate for Discharge Goal: Respiratory complications will improve Outcome: Adequate for Discharge Goal: Cardiovascular complication will be avoided Outcome: Adequate for Discharge   Problem: Activity: Goal: Risk for activity intolerance will decrease Outcome: Adequate for Discharge   Problem: Nutrition: Goal: Adequate nutrition will be maintained Outcome: Adequate for Discharge   Problem: Coping: Goal: Level of anxiety will decrease Outcome: Adequate for Discharge   Problem: Elimination: Goal: Will not experience complications related to bowel motility Outcome: Adequate for Discharge Goal: Will not experience complications related to urinary retention Outcome: Adequate for Discharge   Problem: Pain Managment: Goal: General experience of comfort will improve Outcome: Adequate for Discharge   Problem: Safety: Goal: Ability to remain free from injury will improve Outcome: Adequate for Discharge   Problem: Skin Integrity: Goal: Risk for impaired skin integrity will decrease Outcome: Adequate for Discharge   Problem:  Education: Goal: Required Educational Video(s) Outcome: Adequate for Discharge   Problem: Clinical Measurements: Goal: Postoperative complications will be avoided or minimized Outcome: Adequate for Discharge   Problem: Skin Integrity: Goal: Demonstration of wound healing without infection will improve Outcome: Adequate for Discharge   Problem: Education: Goal: Required Educational Video(s) Outcome: Adequate for Discharge   Problem: Clinical Measurements: Goal: Postoperative complications will be avoided or minimized Outcome: Adequate for Discharge   Problem: Skin Integrity: Goal: Demonstration of wound healing without infection will improve Outcome: Adequate for Discharge

## 2020-08-31 NOTE — Discharge Summary (Signed)
Physician Discharge Summary  Juan Roman KNL:[id number] DOB: 03-09-1976 DOA: 08/28/2020  PCP: Patient, No Pcp Per  Admit date: 08/28/2020 Discharge date: 08/31/2020  Admitted From: Home  Disposition:  Home   Recommendations for Outpatient Follow-up and new medication changes:  1. Follow up with Primary Care in 7 days.  2. Outpatient surgery as scheduled.  3. Continue antibiotic therapy with Amoxicillin (limited options due to patients not access to medications due to cost). 4. New prescription for insulin and glucometer.  5. Pain control with acetaminophen, ibuprofen and oxycodone.   Home Health: no  Equipment/Devices: no    Discharge Condition: stable  CODE STATUS: full  Diet recommendation: diabetic prudent.  Brief/Interim Summary: Patient admitted to the hospital with the working diagnosis of left posterior neck abscess/ Streptococcal type A Pyogenes (present on admission)/ complicated with hyperglycemia.   44 yo male with pmhx of T2DM, diabetic neuropathy and IV drug abuse, pior MRSA infections. Reported 7 days of pain and edema at the left neck region. On his initial physical examination BP 128/94, HR 94. RR 18, 02 97%. Left posterior neck with fullness, positive erythema, severe tenderness and increased local temperature. Lungs clear to auscultation, heart S1-S2 present, abdomen soft and no lower extremity edema. Sodium 130, potassium 4.2, chloride 99, bicarb 22, glucose 553, BUN 11, creatinine 0.63, white count 22.1, hemoglobin 14.5, hematocrit 43.1, platelets 299.  SARS COVID-19 negative.  Urinalysis specific gravity less than 1.005, glucose more than 500 Soft tissue neck CT scan with extensive myositis of the left posterior intrinsic neck musculature associated with fluid collections along the deep fascial layer likely cavitated nodes.  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  Chest radiograph no infiltrates.  Patient as placed on broad spectrum antibiotic therapy  and underwent I&D with significant improvement of his symptoms. Wound cultures positive for Streptococcus Pyogenes.   1. Acute neck/cellulitis due to Streptococcus type A Pyogenes, present on admission. Patient received supportive medical therapy with intravenous fluids and broad-spectrum antibiotic therapy (vancomycin/Zosyn). He underwent successful I&D with improvement of his symptoms. His wound cultures came back positive for Streptococcus type a pyogenes.  Vancomycin was discontinued and added clindamycin to Zosyn. His white cell count at discharge 10.1, he has remained afebrile and his blood cultures are no growth.  Patient will continue antibiotic therapy with Amoxicillin for seven more days. Follow-up outpatient with surgery for team. Continue local wound care.  2. Uncontrolled type 2 diabetes mellitus. Hemoglobin A1c 13.3. Patient received basal insulin up to 40 units daily, Premeal 6 units of insulin aspart and insulin sliding scale.  He has not been taking insulin at home, will give a new prescription along with glucometer for further glucose monitoring. Patient declined a thorough diabetic teaching.  Discharge Diagnoses:  Principal Problem:   Neck abscess Active Problems:   Cellulitis of neck   Infective myositis   Lymphadenitis   T2DM (type 2 diabetes mellitus) ([place])    Discharge Instructions   Allergies as of 08/31/2020      Reactions   Influenza Virus Vac Live Quad Anaphylaxis   Nsaids Nausea And Vomiting   Toradol [ketorolac Tromethamine]    Tramadol       Medication List    STOP taking these medications   HumuLIN R 100 units/mL injection Generic drug: insulin regular   Lantus 100 UNIT/ML injection Generic drug: insulin glargine     TAKE these medications   acetaminophen 500 MG tablet Commonly known as: TYLENOL Take 1 tablet (500 mg total)  by mouth every 6 (six) hours as needed for mild pain.   amoxicillin 500 MG capsule Commonly known as: AMOXIL Take 1  capsule (500 mg total) by mouth every 8 (eight) hours for 7 days.   blood glucose meter kit and supplies Dispense based on patient and insurance preference. Use up to four times daily as directed. (FOR ICD-10 E10.9, E11.9).   gabapentin 400 MG capsule Commonly known as: NEURONTIN Take 400 mg by mouth 4 (four) times daily as needed.   ibuprofen 400 MG tablet Commonly known as: ADVIL Take 1 tablet (400 mg total) by mouth every 6 (six) hours as needed for moderate pain.   insulin aspart protamine- aspart (70-30) 100 UNIT/ML injection Commonly known as: NOVOLOG MIX 70/30 Inject 0.3 mLs (30 Units total) into the skin 2 (two) times daily with a meal. Relion insulin   Insulin Syringes (Disposable) U-100 0.3 ML Misc insulin syringes.   NEEDLE (DISP) 14 G 14G X 1" Misc Insulin needles.   oxyCODONE 5 MG immediate release tablet Commonly known as: Oxy IR/ROXICODONE Take 1 tablet (5 mg total) by mouth every 6 (six) hours as needed for severe pain.       Follow-up Information    [facility], [facility] [place] Follow up.   Specialty: General Surgery Why: Our office is scheduling a follow up appointment for you for wound check in 2 weeks. Please arrive 30 min prior to appointment time. Bring photo ID and insurance information.  Contact information: [address] [place] [address] [phone]              Allergies  Allergen Reactions  . Influenza Virus Vac Live Quad Anaphylaxis  . Nsaids Nausea And Vomiting  . Toradol [Ketorolac Tromethamine]   . Tramadol     Consultations:  SURGERY    Procedures/Studies: CT Soft Tissue Neck W Contrast  Result Date: 08/28/2020 CLINICAL DATA:  Neck pain with swelling for 2 months. Non compliant diabetic EXAM: CT NECK WITH CONTRAST TECHNIQUE: Multidetector CT imaging of the neck was performed using the standard protocol following the bolus administration of intravenous contrast. CONTRAST:  78m OMNIPAQUE IOHEXOL 300 MG/ML  SOLN  COMPARISON:  None. FINDINGS: Pharynx and larynx: No evidence of mass or swelling. Salivary glands: No inflammation, mass, or stone. Thyroid: Normal. Lymph nodes: There are 2 thick-walled cavities along the junction between the subcutaneous fat and deep fascia the level of the left posterior neck, measuring up to 2 cm. Suspect these are cavitary nodes. No contralateral or non cavitated adenopathy. 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 No gross beading of the contralateral ICA. Limited intracranial: Negative Visualized orbits: Negative Mastoids and visualized paranasal sinuses: Clear Skeleton: Muscular expansion and low-density in the left intrinsic neck muscles. Extensive dental caries and periapical erosions. Degenerative endplate spurring. Upper chest: Negative IMPRESSION: 1. Extensive myositis of left posterior intrinsic neck musculature associated with fluid collections along the deep fascial layer, likely cavitated nodes. Suppurative adenitis or tuberculous adenitis are leading considerations. Would not expect this degree of inflammation for cavitating tumor. 2. 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 In this location a posttraumatic cause is likely. Electronically Signed   By: JMonte FantasiaM.D.   On: 08/28/2020 05:18   DG Chest Portable 1 View  Result Date: 08/28/2020 CLINICAL DATA:  Abnormal CT. EXAM: PORTABLE CHEST 1 VIEW COMPARISON:  CT neck 9 08/2020 FINDINGS:  Mediastinum and hilar structures normal. Heart size normal. Low lung volumes. Tiny calcified nodule left lung base suggesting tiny calcified granuloma. No focal infiltrate. No evidence of active TB. No pleural effusion or pneumothorax. No acute bony abnormality. IMPRESSION: Tiny calcified nodule left lung base suggesting tiny calcified granuloma. No focal infiltrate. No evidence of active TB. Electronically Signed   By: Marcello Moores  Register   On:  08/28/2020 05:53      Procedures: Incision and drainage of left posterior neck abscess  Subjective: Patient is feeling better, neck pain continue to improve, no nausea or vomiting, no  Dyspnea or chest pain.   Discharge Exam: Vitals:   08/30/20 2034 08/31/20 0528  BP: 121/85 115/87  Pulse: (!) 101 77  Resp: 18 18  Temp: 98 F (36.7 C) 97.8 F (36.6 C)  SpO2: 99% 98%   Vitals:   08/30/20 0632 08/30/20 1548 08/30/20 2034 08/31/20 0528  BP: 116/87 115/72 121/85 115/87  Pulse: 87 (!) 101 (!) 101 77  Resp: _0 Temp: (!) 97.4 F (36.3 C) 99 F (37.2 C) 98 F (36.7 C) 97.8 F (36.6 C)  TempSrc: Oral Oral Oral Oral  SpO2: 100% 100% 99% 98%  Weight:      Height:        General: Not in pain or dyspnea.  Neurology: Awake and alert, non focal  E ENT: no pallor, no icterus, oral mucosa moist Cardiovascular: No JVD. S1-S2 present, rhythmic, no gallops, rubs, or murmurs. No lower extremity edema. Pulmonary: vesicular breath sounds bilaterally, adequate air movement, no wheezing, rhonchi or rales. Gastrointestinal. Abdomen soft and non tender Skin. Left posterior neck wound with dressing in place, no significant local erythema or local tenderness.  Musculoskeletal: no joint deformities   The results of significant diagnostics from this hospitalization (including imaging, microbiology, ancillary and laboratory) are listed below for reference.     Microbiology: Recent Results (from the past 240 hour(s))  SARS Coronavirus 2 by RT PCR (hospital order, performed in [facility] hospital lab) Nasopharyngeal Nasopharyngeal Swab     Status: None   Collection Time: 08/28/20  6:16 AM   Specimen: Nasopharyngeal Swab  Result Value Ref Range Status   SARS Coronavirus 2 NEGATIVE NEGATIVE Final    Comment: (NOTE) SARS-CoV-2 target nucleic acids are NOT DETECTED.  The SARS-CoV-2 RNA is generally detectable in upper and lower respiratory specimens during the acute phase of  infection. The lowest concentration of SARS-CoV-2 viral copies this assay can detect is 250 copies / mL. A negative result does not preclude SARS-CoV-2 infection and should not be used as the sole basis for treatment or other patient management decisions.  A negative result may occur with improper specimen collection / handling, submission of specimen other than nasopharyngeal swab, presence of viral mutation(s) within the areas targeted by this assay, and inadequate number of viral copies (<250 copies / mL). A negative result must be combined with clinical observations, patient history, and epidemiological information.  Fact Sheet for Patients:   StrictlyIdeas.no  Fact Sheet for Healthcare Providers: BankingDealers.co.za  This test is not yet approved or  cleared by the [place] FDA and has been authorized for detection and/or diagnosis of SARS-CoV-2 by FDA under an Emergency Use Authorization (EUA).  This EUA will remain in effect (meaning this test can be used) for the duration of the COVID-19 declaration under Section 564(b)(1) of the Act, 21 U.S.C. section 360bbb-3(b)(1), unless the authorization is terminated or revoked sooner.  Performed at  [facility], [address] [place], [place] [address]   Blood culture (routine x 2)     Status: None (Preliminary result)   Collection Time: 08/28/20  6:25 AM   Specimen: BLOOD  Result Value Ref Range Status   Specimen Description   Final    BLOOD RIGHT ANTECUBITAL Performed at [facility], [address] [place], [place] [address]    Special Requests   Final    BOTTLES DRAWN AEROBIC AND ANAEROBIC Blood Culture adequate volume Performed at [facility], [address] [place], [place] [address]    Culture   Final    NO GROWTH 2 DAYS Performed at [facility], [place] [address] [place], [place] [address]    Report Status PENDING   Incomplete  Blood culture (routine x 2)     Status: None (Preliminary result)   Collection Time: 08/28/20  6:30 AM   Specimen: BLOOD RIGHT FOREARM  Result Value Ref Range Status   Specimen Description   Final    BLOOD RIGHT FOREARM Performed at [facility], [address] [place], [place] [address]    Special Requests   Final    BOTTLES DRAWN AEROBIC AND ANAEROBIC Blood Culture adequate volume Performed at [facility], [address] [place], [place] [address]    Culture   Final    NO GROWTH 2 DAYS Performed at [facility], [place] [address], [place], [place] [address]    Report Status PENDING  Incomplete  MRSA PCR Screening     Status: None   Collection Time: 08/28/20  3:04 PM   Specimen: Nasal Mucosa; Nasopharyngeal  Result Value Ref Range Status   MRSA by PCR NEGATIVE NEGATIVE Final    Comment:        The GeneXpert MRSA Assay (FDA approved for NASAL specimens only), is one component of a comprehensive MRSA colonization surveillance program. It is not intended to diagnose MRSA infection nor to guide or monitor treatment for MRSA infections. Performed at [facility], [place] [address], [place], [place] [address]   Aerobic/Anaerobic Culture (surgical/deep wound)     Status: None (Preliminary result)   Collection Time: 08/29/20 10:27 AM   Specimen: Neck, Left; Abscess  Result Value Ref Range Status   Specimen Description ABSCESS LEFT NECK  Final   Special Requests NONE  Final   Gram Stain   Final    FEW WBC PRESENT, PREDOMINANTLY PMN FEW GRAM POSITIVE COCCI IN PAIRS    Culture   Final    RARE GROUP A STREP (S.PYOGENES) ISOLATED Beta hemolytic streptococci are predictably susceptible to penicillin and other beta lactams. Susceptibility testing not routinely performed. Performed at [facility], [place] [address], [place], [place] [address]    Report Status PENDING  Incomplete     Labs: BNP (last 3 results) No results for  input(s): BNP in the last 8760 hours. Basic Metabolic Panel: Recent Labs  Lab 08/28/20 0313 08/30/20 0746 08/31/20 0752  NA 130* 133* 131*  K 4.2 3.9 4.4  CL 99 99 97*  CO2 _0 GLUCOSE 553* 359* 434*  BUN _1 CREATININE 0.63 0.65 0.73  CALCIUM 8.9 9.0 9.0   Liver Function Tests: No results for input(s): AST, ALT, ALKPHOS, BILITOT, PROT, ALBUMIN in the last 168 hours. No results for input(s): LIPASE, AMYLASE in the last 168 hours. No  results for input(s): AMMONIA in the last 168 hours. CBC: Recent Labs  Lab 08/28/20 0313 08/30/20 0746 08/31/20 0752  WBC 22.1* 10.4 10.1  NEUTROABS 15.7*  --  5.5  HGB 14.5 14.1 13.6  HCT 43.1 44.5 42.9  MCV 84.3 85.2 85.8  PLT 298 253 277   Cardiac Enzymes: Recent Labs  Lab 08/28/20 1517  CKTOTAL 31*   BNP: Invalid input(s): POCBNP CBG: Recent Labs  Lab 08/30/20 0804 08/30/20 1210 08/30/20 1701 08/30/20 2033 08/31/20 0745  GLUCAP 388* 382* 360* 226* 387*   D-Dimer No results for input(s): DDIMER in the last 72 hours. Hgb A1c Recent Labs    08/28/20 1517  HGBA1C 13.3*   Lipid Profile No results for input(s): CHOL, HDL, LDLCALC, TRIG, CHOLHDL, LDLDIRECT in the last 72 hours. Thyroid function studies No results for input(s): TSH, T4TOTAL, T3FREE, THYROIDAB in the last 72 hours.  Invalid input(s): FREET3 Anemia work up No results for input(s): VITAMINB12, FOLATE, FERRITIN, TIBC, IRON, RETICCTPCT in the last 72 hours. Urinalysis    Component Value Date/Time   COLORURINE COLORLESS (A) 08/28/2020 0313   APPEARANCEUR CLEAR 08/28/2020 0313   LABSPEC <1.005 (L) 08/28/2020 0313   PHURINE 7.0 08/28/2020 0313   GLUCOSEU >=500 (A) 08/28/2020 0313   HGBUR TRACE (A) 08/28/2020 0313   BILIRUBINUR NEGATIVE 08/28/2020 0313   KETONESUR NEGATIVE 08/28/2020 0313   PROTEINUR NEGATIVE 08/28/2020 0313   NITRITE NEGATIVE 08/28/2020 0313   LEUKOCYTESUR NEGATIVE 08/28/2020 0313   Sepsis Labs Invalid input(s):  PROCALCITONIN,  WBC,  LACTICIDVEN Microbiology Recent Results (from the past 240 hour(s))  SARS Coronavirus 2 by RT PCR (hospital order, performed in [place] hospital lab) Nasopharyngeal Nasopharyngeal Swab     Status: None   Collection Time: 08/28/20  6:16 AM   Specimen: Nasopharyngeal Swab  Result Value Ref Range Status   SARS Coronavirus 2 NEGATIVE NEGATIVE Final    Comment: (NOTE) SARS-CoV-2 target nucleic acids are NOT DETECTED.  The SARS-CoV-2 RNA is generally detectable in upper and lower respiratory specimens during the acute phase of infection. The lowest concentration of SARS-CoV-2 viral copies this assay can detect is 250 copies / mL. A negative result does not preclude SARS-CoV-2 infection and should not be used as the sole basis for treatment or other patient management decisions.  A negative result may occur with improper specimen collection / handling, submission of specimen other than nasopharyngeal swab, presence of viral mutation(s) within the areas targeted by this assay, and inadequate number of viral copies (<250 copies / mL). A negative result must be combined with clinical observations, patient history, and epidemiological information.  Fact Sheet for Patients:   StrictlyIdeas.no  Fact Sheet for Healthcare Providers: BankingDealers.co.za  This test is not yet approved or  cleared by the [place] FDA and has been authorized for detection and/or diagnosis of SARS-CoV-2 by FDA under an Emergency Use Authorization (EUA).  This EUA will remain in effect (meaning this test can be used) for the duration of the COVID-19 declaration under Section 564(b)(1) of the Act, 21 U.S.C. section 360bbb-3(b)(1), unless the authorization is terminated or revoked sooner.  Performed at [facility], [address] [place], [place] [address]   Blood culture (routine x 2)     Status: None (Preliminary result)    Collection Time: 08/28/20  6:25 AM   Specimen: BLOOD  Result Value Ref Range Status   Specimen Description   Final    BLOOD RIGHT ANTECUBITAL Performed at [facility]  [address] [place], [place] [address]    Special Requests   Final    BOTTLES DRAWN AEROBIC AND ANAEROBIC Blood Culture adequate volume Performed at [facility], [address] [place], [place] [address]    Culture   Final    NO GROWTH 2 DAYS Performed at [facility], [place] [address] [place], [place] [address]    Report Status PENDING  Incomplete  Blood culture (routine x 2)     Status: None (Preliminary result)   Collection Time: 08/28/20  6:30 AM   Specimen: BLOOD RIGHT FOREARM  Result Value Ref Range Status   Specimen Description   Final    BLOOD RIGHT FOREARM Performed at [facility], [address] [place], [place] [address]    Special Requests   Final    BOTTLES DRAWN AEROBIC AND ANAEROBIC Blood Culture adequate volume Performed at [facility], [address] [place], [place] [address]    Culture   Final    NO GROWTH 2 DAYS Performed at [facility], [place] [address], [place], [place] [address]    Report Status PENDING  Incomplete  MRSA PCR Screening     Status: None   Collection Time: 08/28/20  3:04 PM   Specimen: Nasal Mucosa; Nasopharyngeal  Result Value Ref Range Status   MRSA by PCR NEGATIVE NEGATIVE Final    Comment:        The GeneXpert MRSA Assay (FDA approved for NASAL specimens only), is one component of a comprehensive MRSA colonization surveillance program. It is not intended to diagnose MRSA infection nor to guide or monitor treatment for MRSA infections. Performed at [facility], [place] [address] [place], [place] [address]   Aerobic/Anaerobic Culture (surgical/deep wound)     Status: None (Preliminary result)   Collection Time: 08/29/20 10:27 AM   Specimen: Neck, Left; Abscess  Result Value Ref Range Status    Specimen Description ABSCESS LEFT NECK  Final   Special Requests NONE  Final   Gram Stain   Final    FEW WBC PRESENT, PREDOMINANTLY PMN FEW GRAM POSITIVE COCCI IN PAIRS    Culture   Final    RARE GROUP A STREP (S.PYOGENES) ISOLATED Beta hemolytic streptococci are predictably susceptible to penicillin and other beta lactams. Susceptibility testing not routinely performed. Performed at [facility], [place] [address], [place], [place] [address]    Report Status PENDING  Incomplete     Time coordinating discharge: 45 minutes  SIGNED:   Tawni Millers, MD  [facility] 08/31/2020, 10:03 AM

## 2020-09-01 LAB — SURGICAL PATHOLOGY

## 2020-09-02 LAB — CULTURE, BLOOD (ROUTINE X 2)
Culture: NO GROWTH
Culture: NO GROWTH
Special Requests: ADEQUATE
Special Requests: ADEQUATE

## 2020-09-03 LAB — AEROBIC/ANAEROBIC CULTURE W GRAM STAIN (SURGICAL/DEEP WOUND)

## 2020-09-04 LAB — CULTURE, BLOOD (ROUTINE X 2): Culture: NO GROWTH

## 2020-09-30 ENCOUNTER — Encounter (HOSPITAL_BASED_OUTPATIENT_CLINIC_OR_DEPARTMENT_OTHER): Payer: Self-pay | Admitting: Emergency Medicine

## 2020-09-30 ENCOUNTER — Emergency Department (HOSPITAL_BASED_OUTPATIENT_CLINIC_OR_DEPARTMENT_OTHER)
Admission: EM | Admit: 2020-09-30 | Discharge: 2020-09-30 | Disposition: A | Discharge status: Home / Self Care | Payer: Medicaid Other | Attending: Emergency Medicine | Admitting: Emergency Medicine

## 2020-09-30 ENCOUNTER — Emergency Department (HOSPITAL_BASED_OUTPATIENT_CLINIC_OR_DEPARTMENT_OTHER): Payer: Medicaid Other

## 2020-09-30 ENCOUNTER — Other Ambulatory Visit: Payer: Self-pay

## 2020-09-30 DIAGNOSIS — M545 Low back pain, unspecified: Secondary | ICD-10-CM | POA: Diagnosis present

## 2020-09-30 DIAGNOSIS — F17210 Nicotine dependence, cigarettes, uncomplicated: Secondary | ICD-10-CM | POA: Insufficient documentation

## 2020-09-30 DIAGNOSIS — E119 Type 2 diabetes mellitus without complications: Secondary | ICD-10-CM | POA: Insufficient documentation

## 2020-09-30 DIAGNOSIS — Z794 Long term (current) use of insulin: Secondary | ICD-10-CM | POA: Insufficient documentation

## 2020-09-30 MED ORDER — CYCLOBENZAPRINE HCL 10 MG PO TABS: 10.0000 mg | ORAL_TABLET | Freq: Two times a day (BID) | ORAL | 0 refills | Status: DC | PRN

## 2020-09-30 MED ORDER — OXYCODONE HCL 5 MG PO TABS
5.0000 mg | ORAL_TABLET | Freq: Once | ORAL | Status: AC
  Administered 2020-09-30: 5 mg via ORAL
  Filled 2020-09-30: qty 1

## 2020-09-30 NOTE — ED Triage Notes (Addendum)
Brought by ems after falling in the parking lot.  Reports he lost his balance stepping onto the curb then spun around and fell on his back.  C/o pain in mid to lower back.  Has not taken anything for pain.  Also reports he caught himself with his right wrist and having pain there.

## 2020-09-30 NOTE — ED Notes (Signed)
Pt not in room to assess.

## 2020-09-30 NOTE — ED Notes (Signed)
Tried to discharge patient but he is not in [employer].  Discharge paper is still

## 2020-09-30 NOTE — ED Notes (Signed)
Pt called for room with no answer. 

## 2020-09-30 NOTE — Discharge Instructions (Addendum)
Take 400 mg of ibuprofen every 8 hours as needed for pain, take 1000 mg of Tylenol every 6 hours as needed for pain, take muscle relaxant Flexeril as needed do not mix with alcohol or drugs as this can make you sleepy

## 2020-09-30 NOTE — ED Notes (Signed)
Pt not in room for d/c. Unable to locate pt for d/c instructions

## 2020-09-30 NOTE — ED Provider Notes (Signed)
[facility] EMERGENCY DEPARTMENT Provider Note   CSN: [id number] Arrival date & time: 09/30/20  1340     History Chief Complaint  Patient presents with  . Back Pain    Juan Roman is a 44 y.o. male.  The history is provided by the patient.  Back Pain Location:  Lumbar spine Quality:  Aching Pain severity:  Mild Onset quality:  Sudden Timing:  Constant Progression:  Unchanged Chronicity:  New Context: recent injury   Relieved by:  Nothing Worsened by:  Nothing Associated symptoms: no abdominal pain, no abdominal swelling, no bladder incontinence, no bowel incontinence, no chest pain, no dysuria, no fever, no headaches, no leg pain, no numbness, no paresthesias, no pelvic pain, no perianal numbness, no tingling, no weakness and no weight loss        Past Medical History:  Diagnosis Date  . Diabetes mellitus without complication (HCC)     Patient Active Problem List   Diagnosis Date Noted  . T2DM (type 2 diabetes mellitus) (HCC) 08/30/2020  . Cellulitis of neck 08/28/2020  . Neck abscess 08/28/2020  . Infective myositis   . Lymphadenitis     Past Surgical History:  Procedure Laterality Date  . APPENDECTOMY    . BACK SURGERY    . CHOLECYSTECTOMY    . INCISION AND DRAINAGE ABSCESS Left 08/29/2020   Procedure: INCISION AND DRAINAGE  OF POSTERIOR NECK ABSCESS;  Surgeon: Tsuei, Matthew, MD;  Location: [facility] OR;  Service: General;  Laterality: Left;  . TONSILLECTOMY         No family history on file.  Social History   Tobacco Use  . Smoking status: Current Every Day Smoker    Packs/day: 1.00    Types: Cigarettes  . Smokeless tobacco: Never Used  Vaping Use  . Vaping Use: Never used  Substance Use Topics  . Alcohol use: Not Currently  . Drug use: Yes    Types: Marijuana    Home Medications Prior to Admission medications   Medication Sig Start Date End Date Taking? Authorizing Provider  acetaminophen (TYLENOL) 500 MG tablet Take 1 tablet  (500 mg total) by mouth every 6 (six) hours as needed for mild pain. 08/31/20   Arrien, Mauricio Daniel, MD  blood glucose meter kit and supplies Dispense based on patient and insurance preference. Use up to four times daily as directed. (FOR ICD-10 E10.9, E11.9). 08/31/20   Arrien, Mauricio Daniel, MD  cyclobenzaprine (FLEXERIL) 10 MG tablet Take 1 tablet (10 mg total) by mouth 2 (two) times daily as needed for muscle spasms. 09/30/20   , , DO  gabapentin (NEURONTIN) 400 MG capsule Take 400 mg by mouth 4 (four) times daily as needed. 08/05/20   [provider]  ibuprofen (ADVIL) 400 MG tablet Take 1 tablet (400 mg total) by mouth every 6 (six) hours as needed for moderate pain. 08/31/20   Arrien, Mauricio Daniel, MD  insulin aspart protamine- aspart (NOVOLOG MIX 70/30) (70-30) 100 UNIT/ML injection Inject 0.3 mLs (30 Units total) into the skin 2 (two) times daily with a meal. Relion insulin 08/31/20 09/30/20  Arrien, Mauricio Daniel, MD  Insulin Syringes, Disposable, U-100 0.3 ML MISC insulin syringes. 08/31/20   Arrien, Mauricio Daniel, MD  NEEDLE, DISP, 14 G 14G X 1" MISC Insulin needles. 08/31/20   Arrien, Mauricio Daniel, MD  oxyCODONE (OXY IR/ROXICODONE) 5 MG immediate release tablet Take 1 tablet (5 mg total) by mouth every 6 (six) hours as needed for severe pain. 08/31/20     Arrien, Mauricio Daniel, MD    Allergies    Influenza virus vac live quad, Nsaids, Toradol [ketorolac tromethamine], and Tramadol  Review of Systems   Review of Systems  Constitutional: Negative for chills, fever and weight loss.  HENT: Negative for ear pain and sore throat.   Eyes: Negative for pain and visual disturbance.  Respiratory: Negative for cough and shortness of breath.   Cardiovascular: Negative for chest pain and palpitations.  Gastrointestinal: Negative for abdominal pain, bowel incontinence and vomiting.  Genitourinary: Negative for bladder incontinence, dysuria, hematuria and pelvic pain.   Musculoskeletal: Positive for back pain. Negative for arthralgias.  Skin: Negative for color change and rash.  Neurological: Negative for tingling, seizures, syncope, weakness, numbness, headaches and paresthesias.  All other systems reviewed and are negative.   Physical Exam Updated Vital Signs  ED Triage Vitals  Enc Vitals Group     BP 09/30/20 1349 (!) 138/94     Pulse Rate 09/30/20 1349 (!) 108     Resp 09/30/20 1349 18     Temp 09/30/20 1349 97.8 F (36.6 C)     Temp src --      SpO2 09/30/20 1349 96 %     Weight 09/30/20 1350 191 lb (86.6 kg)     Height 09/30/20 1350 5' 7" (1.702 m)     Head Circumference --      Peak Flow --      Pain Score 09/30/20 1350 8     Pain Loc --      Pain Edu? --      Excl. in GC? --     Physical Exam Vitals and nursing note reviewed.  Constitutional:      General: He is not in acute distress.    Appearance: He is well-developed. He is not ill-appearing.  HENT:     Head: Normocephalic and atraumatic.     Nose: Nose normal.     Mouth/Throat:     Mouth: Mucous membranes are moist.  Eyes:     Conjunctiva/sclera: Conjunctivae normal.     Pupils: Pupils are equal, round, and reactive to light.  Cardiovascular:     Rate and Rhythm: Normal rate and regular rhythm.     Pulses: Normal pulses.     Heart sounds: Normal heart sounds. No murmur heard.   Pulmonary:     Effort: Pulmonary effort is normal. No respiratory distress.     Breath sounds: Normal breath sounds.  Abdominal:     General: Abdomen is flat.     Palpations: Abdomen is soft.     Tenderness: There is no abdominal tenderness.  Musculoskeletal:        General: Tenderness (back and right wrist) present. No swelling, deformity or signs of injury. Normal range of motion.     Cervical back: Normal range of motion and neck supple.     Comments: No midline spinal tenderness   Skin:    General: Skin is warm and dry.     Capillary Refill: Capillary refill takes less than 2  seconds.  Neurological:     General: No focal deficit present.     Mental Status: He is alert and oriented to person, place, and time.     Cranial Nerves: No cranial nerve deficit.     Sensory: No sensory deficit.     Motor: No weakness.     Coordination: Coordination normal.     Gait: Gait normal.  Psychiatric:          Mood and Affect: Mood normal.     ED Results / Procedures / Treatments   Labs (all labs ordered are listed, but only abnormal results are displayed) Labs Reviewed - No data to display  EKG None  Radiology DG Wrist Complete Right  Result Date: 09/30/2020 CLINICAL DATA:  Fall x today FOOSH injury to Rt wrist. Pt has pain at mid wrist joint. No previous injury known. EXAM: RIGHT WRIST - COMPLETE 3+ VIEW COMPARISON:  None. FINDINGS: There is no evidence of fracture or dislocation. There is no evidence of arthropathy or other focal bone abnormality. Soft tissues are unremarkable. IMPRESSION: Negative. Electronically Signed   By: Nancy  Ballantyne M.D.   On: 09/30/2020 14:21    Procedures Procedures (including critical care time)  Medications Ordered in ED Medications  oxyCODONE (Oxy IR/ROXICODONE) immediate release tablet 5 mg (5 mg Oral Given 09/30/20 1534)    ED Course  I have reviewed the triage vital signs and the nursing notes.  Pertinent labs & imaging results that were available during my care of the patient were reviewed by me and considered in my medical decision making (see chart for details).    MDM Rules/Calculators/A&P                          Derec Jamie is a 44-year-old male with history of diabetes who presents to the ED with back pain after a fall.  Normal vitals.  No fever.  X-ray unremarkable to right wrist which was injured during the fall.  Has no obvious deformity or tenderness at this time on my evaluation.  Some tenderness to the paraspinal muscles in the lumbar region.  Overall suspect muscle spasm.  Recommend Motrin, Tylenol.  Will  prescribe Flexeril.  Discharged in good condition.  No concern for cauda equina.  This chart was dictated using voice recognition software.  Despite best efforts to proofread,  errors can occur which can change the documentation meaning.    Final Clinical Impression(s) / ED Diagnoses Final diagnoses:  Acute low back pain without sciatica, unspecified back pain laterality    Rx / DC Orders ED Discharge Orders         Ordered    cyclobenzaprine (FLEXERIL) 10 MG tablet  2 times daily PRN        09/30/20 1537           , , DO 09/30/20 1538  

## 2020-10-14 ENCOUNTER — Emergency Department (HOSPITAL_BASED_OUTPATIENT_CLINIC_OR_DEPARTMENT_OTHER): Payer: Medicaid Other

## 2020-10-14 ENCOUNTER — Encounter (HOSPITAL_BASED_OUTPATIENT_CLINIC_OR_DEPARTMENT_OTHER): Payer: Self-pay | Admitting: Emergency Medicine

## 2020-10-14 ENCOUNTER — Telehealth (HOSPITAL_BASED_OUTPATIENT_CLINIC_OR_DEPARTMENT_OTHER): Payer: Self-pay | Admitting: Emergency Medicine

## 2020-10-14 ENCOUNTER — Other Ambulatory Visit: Payer: Self-pay

## 2020-10-14 ENCOUNTER — Emergency Department (HOSPITAL_BASED_OUTPATIENT_CLINIC_OR_DEPARTMENT_OTHER)
Admission: EM | Admit: 2020-10-14 | Discharge: 2020-10-14 | Disposition: A | Discharge status: Home / Self Care | Payer: Medicaid Other | Attending: Emergency Medicine | Admitting: Emergency Medicine

## 2020-10-14 DIAGNOSIS — M546 Pain in thoracic spine: Secondary | ICD-10-CM | POA: Insufficient documentation

## 2020-10-14 DIAGNOSIS — Z794 Long term (current) use of insulin: Secondary | ICD-10-CM | POA: Diagnosis not present

## 2020-10-14 DIAGNOSIS — R42 Dizziness and giddiness: Secondary | ICD-10-CM | POA: Diagnosis present

## 2020-10-14 DIAGNOSIS — E119 Type 2 diabetes mellitus without complications: Secondary | ICD-10-CM | POA: Diagnosis not present

## 2020-10-14 DIAGNOSIS — F17210 Nicotine dependence, cigarettes, uncomplicated: Secondary | ICD-10-CM | POA: Diagnosis not present

## 2020-10-14 DIAGNOSIS — W11XXXA Fall on and from ladder, initial encounter: Secondary | ICD-10-CM | POA: Insufficient documentation

## 2020-10-14 DIAGNOSIS — M542 Cervicalgia: Secondary | ICD-10-CM | POA: Diagnosis not present

## 2020-10-14 DIAGNOSIS — W19XXXA Unspecified fall, initial encounter: Secondary | ICD-10-CM

## 2020-10-14 HISTORY — DX: Other chronic pain: G89.29

## 2020-10-14 LAB — COMPREHENSIVE METABOLIC PANEL
ALT: 23 U/L (ref 0–44)
AST: 16 U/L (ref 15–41)
Albumin: 4 g/dL (ref 3.5–5.0)
Alkaline Phosphatase: 108 U/L (ref 38–126)
Anion gap: 10 (ref 5–15)
BUN: 12 mg/dL (ref 6–20)
CO2: 23 mmol/L (ref 22–32)
Calcium: 9.5 mg/dL (ref 8.9–10.3)
Chloride: 101 mmol/L (ref 98–111)
Creatinine, Ser: 0.48 mg/dL — ABNORMAL LOW (ref 0.61–1.24)
GFR, Estimated: >60 mL/min (ref >60)
Glucose, Bld: 403 mg/dL — ABNORMAL HIGH (ref 70–99)
Potassium: 3.9 mmol/L (ref 3.5–5.1)
Sodium: 134 mmol/L — ABNORMAL LOW (ref 135–145)
Total Bilirubin: 0.5 mg/dL (ref 0.3–1.2)
Total Protein: 7.5 g/dL (ref 6.5–8.1)

## 2020-10-14 LAB — URINALYSIS, ROUTINE W REFLEX MICROSCOPIC
Bilirubin Urine: NEGATIVE
Glucose, UA: >=500 mg/dL — AB
Hgb urine dipstick: NEGATIVE
Ketones, ur: NEGATIVE mg/dL
Leukocytes,Ua: NEGATIVE
Nitrite: NEGATIVE
Protein, ur: NEGATIVE mg/dL
Specific Gravity, Urine: 1.015 (ref 1.005–1.030)
pH: 6 (ref 5.0–8.0)

## 2020-10-14 LAB — CBC WITH DIFFERENTIAL/PLATELET
Abs Immature Granulocytes: 0.15 10*3/uL — ABNORMAL HIGH (ref 0.00–0.07)
Basophils Absolute: 0.1 10*3/uL (ref 0.0–0.1)
Basophils Relative: 1 %
Eosinophils Absolute: 0.2 10*3/uL (ref 0.0–0.5)
Eosinophils Relative: 2 %
HCT: 47.7 % (ref 39.0–52.0)
Hemoglobin: 15.9 g/dL (ref 13.0–17.0)
Immature Granulocytes: 1 %
Lymphocytes Relative: 24 %
Lymphs Abs: 2.9 10*3/uL (ref 0.7–4.0)
MCH: 28.9 pg (ref 26.0–34.0)
MCHC: 33.3 g/dL (ref 30.0–36.0)
MCV: 86.6 fL (ref 80.0–100.0)
Monocytes Absolute: 0.9 10*3/uL (ref 0.1–1.0)
Monocytes Relative: 7 %
Neutro Abs: 8.2 10*3/uL — ABNORMAL HIGH (ref 1.7–7.7)
Neutrophils Relative %: 65 %
Platelets: 234 10*3/uL (ref 150–400)
RBC: 5.51 MIL/uL (ref 4.22–5.81)
RDW: 13.8 % (ref 11.5–15.5)
WBC: 12.4 10*3/uL — ABNORMAL HIGH (ref 4.0–10.5)
nRBC: 0 % (ref 0.0–0.2)

## 2020-10-14 LAB — CBG MONITORING, ED: Glucose-Capillary: 406 mg/dL — ABNORMAL HIGH (ref 70–99)

## 2020-10-14 LAB — URINALYSIS, MICROSCOPIC (REFLEX)

## 2020-10-14 MED ORDER — ONDANSETRON 4 MG PO TBDP
4.0000 mg | ORAL_TABLET | Freq: Once | ORAL | Status: AC
  Administered 2020-10-14: 4 mg via ORAL
  Filled 2020-10-14: qty 1

## 2020-10-14 MED ORDER — DOXYCYCLINE HYCLATE 100 MG PO CAPS: 100.0000 mg | ORAL_CAPSULE | Freq: Two times a day (BID) | ORAL | 0 refills | Status: DC

## 2020-10-14 MED ORDER — IBUPROFEN 800 MG PO TABS
800.0000 mg | ORAL_TABLET | Freq: Once | ORAL | Status: AC
  Administered 2020-10-14: 800 mg via ORAL
  Filled 2020-10-14: qty 1

## 2020-10-14 NOTE — ED Notes (Signed)
Pt left before discharge instructions. This RN attempted to hand him the instructions, pt will not stop and continue to walk through the employee entrance . Pt left the department.

## 2020-10-14 NOTE — Progress Notes (Signed)
  MATCH Medication Assistance Card  Name: Juan Roman  ID (MRN): [id number]  Bin: [id number] RX Group: [id number]  Discharge Date:10/14/2020 12:44 PM  Expiration Date:10/22/2020  (must be filled within 7 days of discharge)     Dear Juan Roman:  You have been approved to have the prescriptions written by your discharging physician filled through our [facility] (Medication Assistance Through [facility]) program. This program allows for a one-time (no refills) 34-day supply of selected medications for a low copay amount.  The copay is $0.00 per prescription. For instance, if you have one prescription, you will pay $0.00; for two prescriptions, you pay $0.00; for three prescriptions, you pay $0.00; and so on.  Only certain pharmacies are participating in this program with [facility]. You will need to select one of the pharmacies from the attached list and take your prescriptions, this letter, and your photo ID to one of the participating pharmacies.   We are excited that you are able to use the [facility] program to get your medications. These prescriptions must be filled within 7 days of hospital discharge or they will no longer be valid for the [facility] program. Should you have any problems with your prescriptions please contact your case management team member at [phone] or [phone] for [place].   Thank you,  Cathlean Cower [facility]  [facility]

## 2020-10-14 NOTE — Progress Notes (Addendum)
10/14/2020 312 pm TOC CM spoke to pt and discussed importance of taking medications as prescribed to help prevent complications. Pt has agreed to pick up his abx from a pharmacy. States he does not have funds for meds. As he is homeless, living in a hotel. Explained the [facility] program and once per year use. Will do a copay override due to homelessness. Pt will receive for a $0 copay. Pt plans to go back to [place]. Instructed him if he plans to reside in [place], he will need to get Medicaid switched to [facility] plan and follow up with a primary care physician as soon as possible. Pt declined TOC CM arranging follow up appt but agreeable to picking up medication. Will fax MATCH letter to [facility] on [address] in [place]. Made pt aware this pharmacy may not accept MATCH/[employer] [facility]B letter to assist with meds. He will have to pick up from [address] location in [place]. ED provider updated.   Isidoro Donning RN CCM, [facility] ED TOC CM [phone]  Explained to pt that he can call his pharmacy in [place] and they can reach out to his PCP to have meds refilled. The pharmacy in [place] can ship meds to him in [place] so his [place] Medicaid can cover meds. States he is staying in a hotel with brother who is here for work assignment. Isidoro Donning RN CCM, [facility] ED TOC CM [phone]

## 2020-10-14 NOTE — ED Triage Notes (Signed)
Reports he was on a ladder about two feet up when he fell down and hit the back of his head.  Also c/o neck and back pain.

## 2020-10-14 NOTE — ED Provider Notes (Signed)
[facility] EMERGENCY DEPARTMENT Provider Note   CSN: [id number] Arrival date & time: 10/14/20  1056     History Chief Complaint  Patient presents with  . Fall    Juan Roman is a 44 y.o. male who present for evaluation after fall. Patient is on was on a step ladder and got dizzy and fell backward. He has a hx of chronic back pain, IVDU and IDDM. He says that his "insulin is not working."  The patient was changing a light bulb and fell backward.  He hit his head, neck and upper back.  He has chronic upper and lower extremity paresthesia which is not new since he hit his head.  He denies losing consciousness.  Denies headache,vision changes.  HPI     Past Medical History:  Diagnosis Date  . Chronic back pain   . Diabetes mellitus without complication [facility])     Patient Active Problem List   Diagnosis Date Noted  . T2DM (type 2 diabetes mellitus) ([place]) 08/30/2020  . Cellulitis of neck 08/28/2020  . Neck abscess 08/28/2020  . Infective myositis   . Lymphadenitis     Past Surgical History:  Procedure Laterality Date  . APPENDECTOMY    . BACK SURGERY    . CHOLECYSTECTOMY    . INCISION AND DRAINAGE ABSCESS Left 08/29/2020   Procedure: INCISION AND DRAINAGE  OF POSTERIOR NECK ABSCESS;  Surgeon: Donnie Mesa, MD;  Location: [facility];  Service: General;  Laterality: Left;  . TONSILLECTOMY         No family history on file.  Social History   Tobacco Use  . Smoking status: Current Every Day Smoker    Packs/day: 1.00    Types: Cigarettes  . Smokeless tobacco: Never Used  Vaping Use  . Vaping Use: Never used  Substance Use Topics  . Alcohol use: Not Currently  . Drug use: Yes    Types: Marijuana    Home Medications Prior to Admission medications   Medication Sig Start Date End Date Taking? Authorizing Provider  acetaminophen (TYLENOL) 500 MG tablet Take 1 tablet (500 mg total) by mouth every 6 (six) hours as needed for mild pain. 08/31/20   Arrien,  Jimmy Picket, MD  blood glucose meter kit and supplies Dispense based on patient and insurance preference. Use up to four times daily as directed. (FOR ICD-10 E10.9, E11.9). 08/31/20   Arrien, Jimmy Picket, MD  cyclobenzaprine (FLEXERIL) 10 MG tablet Take 1 tablet (10 mg total) by mouth 2 (two) times daily as needed for muscle spasms. 09/30/20   Curatolo, Adam, DO  doxycycline (VIBRAMYCIN) 100 MG capsule Take 1 capsule (100 mg total) by mouth 2 (two) times daily. One po bid x 7 days 10/14/20   Margarita Mail, PA-C  gabapentin (NEURONTIN) 400 MG capsule Take 400 mg by mouth 4 (four) times daily as needed. 08/05/20   [provider]  ibuprofen (ADVIL) 400 MG tablet Take 1 tablet (400 mg total) by mouth every 6 (six) hours as needed for moderate pain. 08/31/20   Arrien, Jimmy Picket, MD  insulin aspart protamine- aspart (NOVOLOG MIX 70/30) (70-30) 100 UNIT/ML injection Inject 0.3 mLs (30 Units total) into the skin 2 (two) times daily with a meal. Relion insulin 08/31/20 09/30/20  Arrien, Jimmy Picket, MD  Insulin Syringes, Disposable, U-100 0.3 ML MISC insulin syringes. 08/31/20   Arrien, Jimmy Picket, MD  NEEDLE, DISP, 14 G 14G X 1" MISC Insulin needles. 08/31/20   Arrien, Jimmy Picket, MD  oxyCODONE (OXY IR/ROXICODONE) 5 MG immediate release tablet Take 1 tablet (5 mg total) by mouth every 6 (six) hours as needed for severe pain. 08/31/20   Arrien, Jimmy Picket, MD    Allergies    Influenza virus vac live quad, Nsaids, Toradol [ketorolac tromethamine], and Tramadol  Review of Systems   Review of Systems Ten systems reviewed and are negative for acute change, except as noted in the HPI.   Physical Exam Updated Vital Signs BP 122/85 (BP Location: Right Arm)   Pulse (!) 103   Temp 97.9 F (36.6 C) (Oral)   Resp 18   Ht '5\' 7"'  (1.702 m)   Wt 90.7 kg   SpO2 98%   BMI 31.32 kg/m   Physical Exam Vitals and nursing note reviewed.  Constitutional:      General: He is  not in acute distress.    Appearance: He is well-developed. He is not diaphoretic.     Comments: Appears older than stated age.  Unable to sit still in the examining bed, twitching.  Noted to have erythematous skin, mydriatic pupils  HENT:     Head: Normocephalic and atraumatic.  Eyes:     General: No scleral icterus.    Conjunctiva/sclera: Conjunctivae normal.  Cardiovascular:     Rate and Rhythm: Normal rate and regular rhythm.     Heart sounds: Normal heart sounds.  Pulmonary:     Effort: Pulmonary effort is normal. No respiratory distress.     Breath sounds: Normal breath sounds.  Abdominal:     Palpations: Abdomen is soft.     Tenderness: There is no abdominal tenderness.  Musculoskeletal:     Cervical back: Normal range of motion and neck supple.  Skin:    General: Skin is warm and dry.     Findings: Lesion present.     Comments: Multiple poorly healing wounds over the back and lower extremities.  Some appear to have mild infection.  History of MRSA.  Neurological:     Mental Status: He is alert.     Comments: Speech is clear and goal oriented, follows commands Major Cranial nerves without deficit, no facial droop Normal strength in upper and lower extremities bilaterally including dorsiflexion and plantar flexion, strong and equal grip strength Sensation normal to light and sharp touch Moves extremities without ataxia, coordination intact Normal finger to nose and rapid alternating movements Neg romberg, no pronator drift Normal gait Normal heel-shin and balance   Psychiatric:        Behavior: Behavior normal.     ED Results / Procedures / Treatments   Labs (all labs ordered are listed, but only abnormal results are displayed) Labs Reviewed  COMPREHENSIVE METABOLIC PANEL - Abnormal; Notable for the following components:      Result Value   Sodium 134 (*)    Glucose, Bld 403 (*)    Creatinine, Ser 0.48 (*)    All other components within normal limits  CBC WITH  DIFFERENTIAL/PLATELET - Abnormal; Notable for the following components:   WBC 12.4 (*)    Neutro Abs 8.2 (*)    Abs Immature Granulocytes 0.15 (*)    All other components within normal limits  URINALYSIS, ROUTINE W REFLEX MICROSCOPIC - Abnormal; Notable for the following components:   Glucose, UA >=500 (*)    All other components within normal limits  URINALYSIS, MICROSCOPIC (REFLEX) - Abnormal; Notable for the following components:   Bacteria, UA RARE (*)    All other components within normal  limits  CBG MONITORING, ED - Abnormal; Notable for the following components:   Glucose-Capillary 406 (*)    All other components within normal limits    EKG None  Radiology DG Thoracic Spine 2 View  Result Date: 10/14/2020 CLINICAL DATA:  Back pain after fall EXAM: THORACIC SPINE 2 VIEWS COMPARISON:  None. FINDINGS: There is no evidence of thoracic spine fracture. Alignment is normal. No other significant bone abnormalities are identified. IMPRESSION: Negative. Electronically Signed   By: Davina Poke D.O.   On: 10/14/2020 12:00   CT Head Wo Contrast  Result Date: 10/14/2020 CLINICAL DATA:  Patient fell off a ladder and struck back of head. Neck and back pain. EXAM: CT HEAD WITHOUT CONTRAST CT CERVICAL SPINE WITHOUT CONTRAST TECHNIQUE: Multidetector CT imaging of the head and cervical spine was performed following the standard protocol without intravenous contrast. Multiplanar CT image reconstructions of the cervical spine were also generated. COMPARISON:  None. FINDINGS: CT HEAD FINDINGS Brain: There is no evidence for acute hemorrhage, hydrocephalus, mass lesion, or abnormal extra-axial fluid collection. No definite CT evidence for acute infarction. Vascular: No hyperdense vessel or unexpected calcification. Skull: No evidence for fracture. No worrisome lytic or sclerotic lesion. Sinuses/Orbits: The visualized paranasal sinuses and mastoid air cells are clear. Visualized portions of the globes  and intraorbital fat are unremarkable. Other: None. CT CERVICAL SPINE FINDINGS Alignment: Straightening of normal cervical lordosis without subluxation. Skull base and vertebrae: No acute fracture. No primary bone lesion or focal pathologic process. Soft tissues and spinal canal: No prevertebral fluid or swelling. No visible canal hematoma. Disc levels: Mild loss of disc height noted at C6-7 with endplate spurring. Upper chest: Negative. Other: None. IMPRESSION: 1. Unremarkable CT evaluation of the brain.  No acute abnormality. 2. No cervical spine fracture. 3. Loss of cervical lordosis. This can be related to patient positioning, muscle spasm or soft tissue injury. Electronically Signed   By: Misty Stanley M.D.   On: 10/14/2020 11:45   CT Cervical Spine Wo Contrast  Result Date: 10/14/2020 CLINICAL DATA:  Patient fell off a ladder and struck back of head. Neck and back pain. EXAM: CT HEAD WITHOUT CONTRAST CT CERVICAL SPINE WITHOUT CONTRAST TECHNIQUE: Multidetector CT imaging of the head and cervical spine was performed following the standard protocol without intravenous contrast. Multiplanar CT image reconstructions of the cervical spine were also generated. COMPARISON:  None. FINDINGS: CT HEAD FINDINGS Brain: There is no evidence for acute hemorrhage, hydrocephalus, mass lesion, or abnormal extra-axial fluid collection. No definite CT evidence for acute infarction. Vascular: No hyperdense vessel or unexpected calcification. Skull: No evidence for fracture. No worrisome lytic or sclerotic lesion. Sinuses/Orbits: The visualized paranasal sinuses and mastoid air cells are clear. Visualized portions of the globes and intraorbital fat are unremarkable. Other: None. CT CERVICAL SPINE FINDINGS Alignment: Straightening of normal cervical lordosis without subluxation. Skull base and vertebrae: No acute fracture. No primary bone lesion or focal pathologic process. Soft tissues and spinal canal: No prevertebral fluid or  swelling. No visible canal hematoma. Disc levels: Mild loss of disc height noted at C6-7 with endplate spurring. Upper chest: Negative. Other: None. IMPRESSION: 1. Unremarkable CT evaluation of the brain.  No acute abnormality. 2. No cervical spine fracture. 3. Loss of cervical lordosis. This can be related to patient positioning, muscle spasm or soft tissue injury. Electronically Signed   By: Misty Stanley M.D.   On: 10/14/2020 11:45    Procedures Procedures (including critical care time)  Medications Ordered in  ED Medications  ibuprofen (ADVIL) tablet 800 mg (800 mg Oral Given 10/14/20 1233)  ondansetron (ZOFRAN-ODT) disintegrating tablet 4 mg (4 mg Oral Given 10/14/20 1233)    ED Course  I have reviewed the triage vital signs and the nursing notes.  Pertinent labs & imaging results that were available during my care of the patient were reviewed by me and considered in my medical decision making (see chart for details).    MDM Rules/Calculators/A&P                           Patient here after fall.  I ordered obtained and reviewed images of the head, neck via CT and thoracic spine which showed no acute abnormalities.  I obtained labs which show that his blood sugar is 400.  He does state that he is taking his insulin but is not working well.  Patient does have a little bit of an elevated white blood cell count likely secondary to acute phase reactant in the setting of pain.  He also appears to have multiple skin infections.  I have ordered doxycycline.  The patient apparently did not wish to wait any further after being offered Motrin for his pain and left prior to getting his discharge paperwork.  I contacted our transition of care specialist Jessee Avers who has been in touch with the patient and will send in his antibiotic.  Was hoping to get him into one of our clinics for appropriate management of his medical conditions. Final Clinical Impression(s) / ED Diagnoses Final diagnoses:    Fall, initial encounter    Rx / DC Orders ED Discharge Orders         Ordered    doxycycline (VIBRAMYCIN) 100 MG capsule  2 times daily        10/14/20 1230           Margarita Mail, PA-C 10/14/20 [place], [place], DO 10/14/20 1444

## 2020-10-14 NOTE — Telephone Encounter (Signed)
Patient would like his antibiotic- I have escribed it to [facility]

## 2020-10-14 NOTE — Progress Notes (Signed)
TOC CM received referral to assist pt with PCP and medications. Pt per ED provider eloped and did not stay to receive his dc instructions. Attempted call to pt to follow up. Left HIPAA compliant message in voice mail for a return call.

## 2020-10-14 NOTE — Discharge Instructions (Addendum)
Get help right away if: You develop new bowel or bladder control problems. You have unusual weakness or numbness in your arms or legs. You develop nausea or vomiting. You develop abdominal pain. You feel faint.

## 2020-10-20 ENCOUNTER — Emergency Department (HOSPITAL_BASED_OUTPATIENT_CLINIC_OR_DEPARTMENT_OTHER)
Admission: EM | Admit: 2020-10-20 | Discharge: 2020-10-20 | Disposition: A | Discharge status: Home / Self Care | Payer: Medicaid Other | Attending: Emergency Medicine | Admitting: Emergency Medicine

## 2020-10-20 DIAGNOSIS — R739 Hyperglycemia, unspecified: Secondary | ICD-10-CM

## 2020-10-20 DIAGNOSIS — E1165 Type 2 diabetes mellitus with hyperglycemia: Secondary | ICD-10-CM | POA: Diagnosis present

## 2020-10-20 DIAGNOSIS — F17210 Nicotine dependence, cigarettes, uncomplicated: Secondary | ICD-10-CM | POA: Diagnosis not present

## 2020-10-20 DIAGNOSIS — Z794 Long term (current) use of insulin: Secondary | ICD-10-CM | POA: Diagnosis not present

## 2020-10-20 LAB — COMPREHENSIVE METABOLIC PANEL
ALT: 30 U/L (ref 0–44)
AST: 22 U/L (ref 15–41)
Albumin: 4.7 g/dL (ref 3.5–5.0)
Alkaline Phosphatase: 125 U/L (ref 38–126)
Anion gap: 12 (ref 5–15)
BUN: 15 mg/dL (ref 6–20)
CO2: 22 mmol/L (ref 22–32)
Calcium: 9.6 mg/dL (ref 8.9–10.3)
Chloride: 98 mmol/L (ref 98–111)
Creatinine, Ser: 0.79 mg/dL (ref 0.61–1.24)
GFR, Estimated: >60 mL/min (ref >60)
Glucose, Bld: 483 mg/dL — ABNORMAL HIGH (ref 70–99)
Potassium: 4.2 mmol/L (ref 3.5–5.1)
Sodium: 132 mmol/L — ABNORMAL LOW (ref 135–145)
Total Bilirubin: 0.7 mg/dL (ref 0.3–1.2)
Total Protein: 9.2 g/dL — ABNORMAL HIGH (ref 6.5–8.1)

## 2020-10-20 LAB — CBC
HCT: 52.1 % — ABNORMAL HIGH (ref 39.0–52.0)
Hemoglobin: 17.6 g/dL — ABNORMAL HIGH (ref 13.0–17.0)
MCH: 29.2 pg (ref 26.0–34.0)
MCHC: 33.8 g/dL (ref 30.0–36.0)
MCV: 86.5 fL (ref 80.0–100.0)
Platelets: 271 10*3/uL (ref 150–400)
RBC: 6.02 MIL/uL — ABNORMAL HIGH (ref 4.22–5.81)
RDW: 13.7 % (ref 11.5–15.5)
WBC: 14.6 10*3/uL — ABNORMAL HIGH (ref 4.0–10.5)
nRBC: 0 % (ref 0.0–0.2)

## 2020-10-20 LAB — URINALYSIS, MICROSCOPIC (REFLEX)
RBC / HPF: NONE SEEN RBC/hpf (ref 0–5)
WBC, UA: NONE SEEN WBC/hpf (ref 0–5)

## 2020-10-20 LAB — URINALYSIS, ROUTINE W REFLEX MICROSCOPIC
Bilirubin Urine: NEGATIVE
Glucose, UA: >=500 mg/dL — AB
Hgb urine dipstick: NEGATIVE
Ketones, ur: NEGATIVE mg/dL
Leukocytes,Ua: NEGATIVE
Nitrite: NEGATIVE
Protein, ur: NEGATIVE mg/dL
Specific Gravity, Urine: 1.01 (ref 1.005–1.030)
pH: 5 (ref 5.0–8.0)

## 2020-10-20 LAB — CBG MONITORING, ED
Glucose-Capillary: 526 mg/dL (ref 70–99)
Glucose-Capillary: 329 mg/dL — ABNORMAL HIGH (ref 70–99)

## 2020-10-20 MED ORDER — INSULIN ASPART PROT & ASPART (70-30 MIX) 100 UNIT/ML ~~LOC~~ SUSP
8.0000 [IU] | Freq: Once | SUBCUTANEOUS | Status: AC
  Administered 2020-10-20: 8 [IU] via SUBCUTANEOUS
  Filled 2020-10-20: qty 8

## 2020-10-20 MED ORDER — SODIUM CHLORIDE 0.9 % IV BOLUS
1000.0000 mL | Freq: Once | INTRAVENOUS | Status: AC
  Administered 2020-10-20: 1000 mL via INTRAVENOUS

## 2020-10-20 MED ORDER — INSULIN ASPART PROT & ASPART (70-30 MIX) 100 UNIT/ML ~~LOC~~ SUSP: 30.0000 [IU] | Freq: Two times a day (BID) | SUBCUTANEOUS | 0 refills | Status: DC

## 2020-10-20 MED ORDER — ACETAMINOPHEN 325 MG PO TABS
650.0000 mg | ORAL_TABLET | Freq: Once | ORAL | Status: AC
  Administered 2020-10-20: 650 mg via ORAL
  Filled 2020-10-20: qty 2

## 2020-10-20 MED FILL — NOVOLOG MIX 70-30 FLEXPEN S: (70-30) 100 | 15 days supply | Qty: 9 | Fill #0

## 2020-10-20 NOTE — ED Notes (Signed)
Pt requesting pain medication; MD aware.

## 2020-10-20 NOTE — ED Triage Notes (Signed)
Pt is here with family from out of town.  Pt does not have his insulin and has been out for a while.  Pt was given some insulin from prior visit but is now out of that.  Blood sugar on EMS in 400's.  Pt states his baseline is 300-400.  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

## 2020-10-20 NOTE — Progress Notes (Signed)
..   Transition of Care [facility]) - Emergency Department Mini Assessment   Patient Details  Name: Juan Roman MRN: [id number] Date of Birth: 02/03/1976  Transition of Care [facility]) CM/SW Contact:    Elliot Cousin, RN Phone Number:  [phone] 10/20/2020, 1:54 PM   Clinical Narrative: TOC CM spoke to pt and did follow up on medications. Explained to discuss with [facility] about getting one time free fill. Appt scheduled for 10/30/2020 at 10 am. Explained if you unable to keep appt, please reschedule. Pt states he plans to be in [place] for approximately one more month.    ED Mini Assessment: What brought you to the Emergency Department? : need diabetes medication  Barriers to Discharge: No Barriers Identified     Means of departure: Car  Interventions which prevented an admission or readmission: PCP Appointment Scheduled, Medication Review    Patient Contact and Communications        ,                 Admission diagnosis:  Out of insulin Patient Active Problem List   Diagnosis Date Noted  . T2DM (type 2 diabetes mellitus) (HCC) 08/30/2020  . Cellulitis of neck 08/28/2020  . Neck abscess 08/28/2020  . Infective myositis   . Lymphadenitis    PCP:  Patient, No Pcp Per Pharmacy:   [facility] - [place], [place] [address] [place] [address] Phone: [phone] Fax: [phone]

## 2020-10-20 NOTE — ED Provider Notes (Signed)
[place] EMERGENCY DEPARTMENT Provider Note   CSN: [id number] Arrival date & time: 10/20/20  1024     History Chief Complaint  Patient presents with  . Hyperglycemia    Juan Roman is a 44 y.o. male.  He has a history of diabetes and has been out of his insulin for a few days.  He said he woke up today and felt really tired and weak.  He checked his blood sugar and it was high.  He is visiting from [place] where his medication is.  He does not have any money so would be unable to get a prescription of any medication.  No fevers or chills.  No chest pain or shortness of breath.  The history is provided by the patient.  Hyperglycemia Blood sugar level PTA:  High Severity:  Moderate Onset quality:  Gradual Timing:  Constant Progression:  Worsening Chronicity:  Recurrent Diabetes status:  Controlled with insulin Context: noncompliance   Relieved by:  Nothing Ineffective treatments:  None tried Associated symptoms: dehydration, dizziness and fatigue   Associated symptoms: no abdominal pain, no chest pain, no dysuria, no fever, no shortness of breath and no vomiting        Past Medical History:  Diagnosis Date  . Chronic back pain   . Diabetes mellitus without complication [facility])     Patient Active Problem List   Diagnosis Date Noted  . T2DM (type 2 diabetes mellitus) ([place]) 08/30/2020  . Cellulitis of neck 08/28/2020  . Neck abscess 08/28/2020  . Infective myositis   . Lymphadenitis     Past Surgical History:  Procedure Laterality Date  . APPENDECTOMY    . BACK SURGERY    . CHOLECYSTECTOMY    . INCISION AND DRAINAGE ABSCESS Left 08/29/2020   Procedure: INCISION AND DRAINAGE  OF POSTERIOR NECK ABSCESS;  Surgeon: Donnie Mesa, MD;  Location: [facility];  Service: General;  Laterality: Left;  . TONSILLECTOMY         No family history on file.  Social History   Tobacco Use  . Smoking status: Current Every Day Smoker    Packs/day: 1.00    Types:  Cigarettes  . Smokeless tobacco: Never Used  Vaping Use  . Vaping Use: Never used  Substance Use Topics  . Alcohol use: Not Currently  . Drug use: Yes    Types: Marijuana    Home Medications Prior to Admission medications   Medication Sig Start Date End Date Taking? Authorizing Provider  acetaminophen (TYLENOL) 500 MG tablet Take 1 tablet (500 mg total) by mouth every 6 (six) hours as needed for mild pain. 08/31/20   Arrien, Jimmy Picket, MD  blood glucose meter kit and supplies Dispense based on patient and insurance preference. Use up to four times daily as directed. (FOR ICD-10 E10.9, E11.9). 08/31/20   Arrien, Jimmy Picket, MD  cyclobenzaprine (FLEXERIL) 10 MG tablet Take 1 tablet (10 mg total) by mouth 2 (two) times daily as needed for muscle spasms. 09/30/20   Curatolo, Adam, DO  gabapentin (NEURONTIN) 400 MG capsule Take 400 mg by mouth 4 (four) times daily as needed. 08/05/20   [provider]  ibuprofen (ADVIL) 400 MG tablet Take 1 tablet (400 mg total) by mouth every 6 (six) hours as needed for moderate pain. 08/31/20   Arrien, Jimmy Picket, MD  insulin aspart protamine- aspart (NOVOLOG MIX 70/30) (70-30) 100 UNIT/ML injection Inject 0.3 mLs (30 Units total) into the skin 2 (two) times daily with a meal.  Relion insulin 08/31/20 09/30/20  Arrien, Jimmy Picket, MD  Insulin Syringes, Disposable, U-100 0.3 ML MISC insulin syringes. 08/31/20   Arrien, Jimmy Picket, MD  NEEDLE, DISP, 14 G 14G X 1" MISC Insulin needles. 08/31/20   Arrien, Jimmy Picket, MD  oxyCODONE (OXY IR/ROXICODONE) 5 MG immediate release tablet Take 1 tablet (5 mg total) by mouth every 6 (six) hours as needed for severe pain. 08/31/20   Arrien, Jimmy Picket, MD    Allergies    Influenza virus vac live quad, Nsaids, Toradol [ketorolac tromethamine], and Tramadol  Review of Systems   Review of Systems  Constitutional: Positive for fatigue. Negative for fever.  HENT: Negative for sore throat.     Eyes: Positive for visual disturbance.  Respiratory: Negative for shortness of breath.   Cardiovascular: Negative for chest pain.  Gastrointestinal: Negative for abdominal pain and vomiting.  Genitourinary: Negative for dysuria.  Musculoskeletal: Positive for back pain.  Skin: Negative for rash.  Neurological: Positive for dizziness.    Physical Exam Updated Vital Signs BP (!) 143/97 (BP Location: Right Arm)   Pulse (!) 104   Temp 98.2 F (36.8 C) (Oral)   Resp 18   SpO2 95%   Physical Exam Vitals and nursing note reviewed.  Constitutional:      Appearance: Normal appearance. He is well-developed.  HENT:     Head: Normocephalic and atraumatic.  Eyes:     Conjunctiva/sclera: Conjunctivae normal.  Cardiovascular:     Rate and Rhythm: Regular rhythm. Tachycardia present.     Heart sounds: No murmur heard.   Pulmonary:     Effort: Pulmonary effort is normal. No respiratory distress.     Breath sounds: Normal breath sounds.  Abdominal:     Palpations: Abdomen is soft.     Tenderness: There is no abdominal tenderness.  Musculoskeletal:        General: No deformity or signs of injury. Normal range of motion.     Cervical back: Neck supple.  Skin:    General: Skin is warm and dry.  Neurological:     General: No focal deficit present.     Mental Status: He is alert.     ED Results / Procedures / Treatments   Labs (all labs ordered are listed, but only abnormal results are displayed) Labs Reviewed  CBC - Abnormal; Notable for the following components:      Result Value   WBC 14.6 (*)    RBC 6.02 (*)    Hemoglobin 17.6 (*)    HCT 52.1 (*)    All other components within normal limits  URINALYSIS, ROUTINE W REFLEX MICROSCOPIC - Abnormal; Notable for the following components:   Glucose, UA >=500 (*)    All other components within normal limits  COMPREHENSIVE METABOLIC PANEL - Abnormal; Notable for the following components:   Sodium 132 (*)    Glucose, Bld 483 (*)     Total Protein 9.2 (*)    All other components within normal limits  URINALYSIS, MICROSCOPIC (REFLEX) - Abnormal; Notable for the following components:   Bacteria, UA RARE (*)    All other components within normal limits  CBG MONITORING, ED - Abnormal; Notable for the following components:   Glucose-Capillary 526 (*)    All other components within normal limits  CBG MONITORING, ED - Abnormal; Notable for the following components:   Glucose-Capillary 329 (*)    All other components within normal limits  CBG MONITORING, ED  CBG MONITORING, ED  EKG None  Radiology No results found.  Procedures Procedures (including critical care time)  Medications Ordered in ED Medications  sodium chloride 0.9 % bolus 1,000 mL (0 mLs Intravenous Stopped 10/20/20 1212)  insulin aspart protamine- aspart (NOVOLOG MIX 70/30) injection 8 Units (8 Units Subcutaneous Given 10/20/20 1219)  acetaminophen (TYLENOL) tablet 650 mg (650 mg Oral Given 10/20/20 1219)    ED Course  I have reviewed the triage vital signs and the nursing notes.  Pertinent labs & imaging results that were available during my care of the patient were reviewed by me and considered in my medical decision making (see chart for details).  Clinical Course as of Oct 20 1716  Mon Oct 20, 2020  1259 Patient's sugar is elevated but normal gap.  He was given IV fluids and insulin with improvement in his blood sugar.  Discussed with TOC via secure chat and she is arranging for him to get his medicines at [facility] along with a follow-up appointment.   [MB]    Clinical Course User Index [MB] Hayden Rasmussen, MD   MDM Rules/Calculators/A&P                         This patient complains of fatigue and elevated blood sugars in the setting of not taking his insulin; this involves an extensive number of treatment Options and is a complaint that carries with it a high risk of complications and Morbidity. The differential  includes hyperglycemia, DKA, HH NK, metabolic derangement, infection  I ordered, reviewed and interpreted labs, which included CBC with elevated white count,, elevated hemoglobin likely reflecting some dehydration, chemistries with mildly low sodium and elevated glucose, normal gap, urinalysis without signs of infection I ordered medication IV fluids, insulin, Tylenol Previous records obtained and reviewed in epic, patient was here recently and had refill of his insulin I consulted social work and discussed lab and imaging findings  Critical Interventions: None  After the interventions stated above, I reevaluated the patient and found patient's blood sugar to be improving.  Social work has been helpful in getting the patient's medication and follow-up appointment set up for [facility] and wellness.  Patient agreeable to plan to follow-up with them.  Return instructions discussed.   Final Clinical Impression(s) / ED Diagnoses Final diagnoses:  Hyperglycemia    Rx / DC Orders ED Discharge Orders         Ordered    insulin aspart protamine- aspart (NOVOLOG MIX 70/30) (70-30) 100 UNIT/ML injection  2 times daily with meals        10/20/20 1211           Hayden Rasmussen, MD 10/20/20 1720

## 2020-10-20 NOTE — Discharge Instructions (Addendum)
You were seen in the emergency department for elevated blood sugars and not feeling well.  Your blood sugar was elevated but there was no evidence of DKA.  Your symptoms improved with insulin and fluids.  Please go to the pharmacy at [facility] for your insulin prescription.  We have also gotten your primary care doctor appointment.  Return to the emergency department if any worsening or concerning symptoms

## 2020-10-30 ENCOUNTER — Inpatient Hospital Stay: Payer: Medicaid Other | Admitting: Physician Assistant

## 2020-10-30 DIAGNOSIS — E1169 Type 2 diabetes mellitus with other specified complication: Secondary | ICD-10-CM

## 2020-11-23 ENCOUNTER — Emergency Department (HOSPITAL_COMMUNITY): Payer: Medicaid Other

## 2020-11-23 ENCOUNTER — Encounter (HOSPITAL_COMMUNITY): Payer: Self-pay | Admitting: Student

## 2020-11-23 ENCOUNTER — Other Ambulatory Visit: Payer: Self-pay

## 2020-11-23 ENCOUNTER — Emergency Department (HOSPITAL_COMMUNITY)
Admission: EM | Admit: 2020-11-23 | Discharge: 2020-11-23 | Disposition: A | Discharge status: Home / Self Care | Payer: Medicaid Other | Attending: Emergency Medicine | Admitting: Emergency Medicine

## 2020-11-23 DIAGNOSIS — R059 Cough, unspecified: Secondary | ICD-10-CM | POA: Insufficient documentation

## 2020-11-23 DIAGNOSIS — L989 Disorder of the skin and subcutaneous tissue, unspecified: Secondary | ICD-10-CM | POA: Insufficient documentation

## 2020-11-23 DIAGNOSIS — R062 Wheezing: Secondary | ICD-10-CM | POA: Diagnosis not present

## 2020-11-23 DIAGNOSIS — Z794 Long term (current) use of insulin: Secondary | ICD-10-CM | POA: Diagnosis not present

## 2020-11-23 DIAGNOSIS — E875 Hyperkalemia: Secondary | ICD-10-CM | POA: Insufficient documentation

## 2020-11-23 DIAGNOSIS — F17210 Nicotine dependence, cigarettes, uncomplicated: Secondary | ICD-10-CM | POA: Diagnosis not present

## 2020-11-23 DIAGNOSIS — E1165 Type 2 diabetes mellitus with hyperglycemia: Secondary | ICD-10-CM | POA: Diagnosis not present

## 2020-11-23 DIAGNOSIS — Z20822 Contact with and (suspected) exposure to covid-19: Secondary | ICD-10-CM | POA: Insufficient documentation

## 2020-11-23 DIAGNOSIS — R739 Hyperglycemia, unspecified: Secondary | ICD-10-CM

## 2020-11-23 LAB — RESP PANEL BY RT-PCR (RSV, FLU A&B, COVID)  RVPGX2
Influenza A by PCR: NEGATIVE
Influenza B by PCR: NEGATIVE
Resp Syncytial Virus by PCR: NEGATIVE
SARS Coronavirus 2 by RT PCR: NEGATIVE

## 2020-11-23 LAB — URINALYSIS, ROUTINE W REFLEX MICROSCOPIC
Bacteria, UA: NONE SEEN
Bilirubin Urine: NEGATIVE
Glucose, UA: >=500 mg/dL — AB
Hgb urine dipstick: NEGATIVE
Ketones, ur: 5 mg/dL — AB
Leukocytes,Ua: NEGATIVE
Nitrite: NEGATIVE
Protein, ur: NEGATIVE mg/dL
Specific Gravity, Urine: 1.027 (ref 1.005–1.030)
pH: 6 (ref 5.0–8.0)

## 2020-11-23 LAB — COMPREHENSIVE METABOLIC PANEL
ALT: 22 U/L (ref 0–44)
AST: 30 U/L (ref 15–41)
Albumin: 3.4 g/dL — ABNORMAL LOW (ref 3.5–5.0)
Alkaline Phosphatase: 140 U/L — ABNORMAL HIGH (ref 38–126)
Anion gap: 10 (ref 5–15)
BUN: 21 mg/dL — ABNORMAL HIGH (ref 6–20)
CO2: 24 mmol/L (ref 22–32)
Calcium: 9.5 mg/dL (ref 8.9–10.3)
Chloride: 94 mmol/L — ABNORMAL LOW (ref 98–111)
Creatinine, Ser: 0.92 mg/dL (ref 0.61–1.24)
GFR, Estimated: >60 mL/min (ref >60)
Glucose, Bld: 590 mg/dL (ref 70–99)
Potassium: 5.5 mmol/L — ABNORMAL HIGH (ref 3.5–5.1)
Sodium: 128 mmol/L — ABNORMAL LOW (ref 135–145)
Total Bilirubin: 1.1 mg/dL (ref 0.3–1.2)
Total Protein: 6.8 g/dL (ref 6.5–8.1)

## 2020-11-23 LAB — CBC WITH DIFFERENTIAL/PLATELET
Abs Immature Granulocytes: 0.67 10*3/uL — ABNORMAL HIGH (ref 0.00–0.07)
Basophils Absolute: 0.1 10*3/uL (ref 0.0–0.1)
Basophils Relative: 0 %
Eosinophils Absolute: 0.4 10*3/uL (ref 0.0–0.5)
Eosinophils Relative: 3 %
HCT: 46 % (ref 39.0–52.0)
Hemoglobin: 15.3 g/dL (ref 13.0–17.0)
Immature Granulocytes: 4 %
Lymphocytes Relative: 27 %
Lymphs Abs: 4.4 10*3/uL — ABNORMAL HIGH (ref 0.7–4.0)
MCH: 29.3 pg (ref 26.0–34.0)
MCHC: 33.3 g/dL (ref 30.0–36.0)
MCV: 88.1 fL (ref 80.0–100.0)
Monocytes Absolute: 1.4 10*3/uL — ABNORMAL HIGH (ref 0.1–1.0)
Monocytes Relative: 9 %
Neutro Abs: 9.2 10*3/uL — ABNORMAL HIGH (ref 1.7–7.7)
Neutrophils Relative %: 57 %
Platelets: 265 10*3/uL (ref 150–400)
RBC: 5.22 MIL/uL (ref 4.22–5.81)
RDW: 13.2 % (ref 11.5–15.5)
WBC: 16.1 10*3/uL — ABNORMAL HIGH (ref 4.0–10.5)
nRBC: 0 % (ref 0.0–0.2)

## 2020-11-23 LAB — CBG MONITORING, ED
Glucose-Capillary: 572 mg/dL (ref 70–99)
Glucose-Capillary: 435 mg/dL — ABNORMAL HIGH (ref 70–99)
Glucose-Capillary: 301 mg/dL — ABNORMAL HIGH (ref 70–99)

## 2020-11-23 MED ORDER — SODIUM CHLORIDE 0.9 % IV BOLUS
1000.0000 mL | Freq: Once | INTRAVENOUS | Status: AC
  Administered 2020-11-23: 1000 mL via INTRAVENOUS

## 2020-11-23 MED ORDER — ACETAMINOPHEN 325 MG PO TABS
650.0000 mg | ORAL_TABLET | Freq: Once | ORAL | Status: AC
  Administered 2020-11-23: 650 mg via ORAL
  Filled 2020-11-23: qty 2

## 2020-11-23 MED ORDER — ALBUTEROL SULFATE HFA 108 (90 BASE) MCG/ACT IN AERS
6.0000 | INHALATION_SPRAY | Freq: Once | RESPIRATORY_TRACT | Status: AC
  Administered 2020-11-23: 6 via RESPIRATORY_TRACT
  Filled 2020-11-23: qty 6.7

## 2020-11-23 MED ORDER — SODIUM CHLORIDE 0.9 % IV BOLUS: 500.0000 mL | Freq: Once | INTRAVENOUS | Status: DC

## 2020-11-23 MED ORDER — INSULIN ASPART 100 UNIT/ML ~~LOC~~ SOLN
8.0000 [IU] | Freq: Once | SUBCUTANEOUS | Status: AC
  Administered 2020-11-23: 8 [IU] via INTRAVENOUS
  Filled 2020-11-23: qty 0.08

## 2020-11-23 MED ORDER — AEROCHAMBER Z-STAT PLUS/MEDIUM MISC: 1.0000 | Freq: Once | Status: DC

## 2020-11-23 MED ORDER — INSULIN ASPART 100 UNIT/ML ~~LOC~~ SOLN
7.0000 [IU] | Freq: Once | SUBCUTANEOUS | Status: AC
  Administered 2020-11-23: 7 [IU] via INTRAVENOUS
  Filled 2020-11-23: qty 0.07

## 2020-11-23 MED ORDER — BENZONATATE 100 MG PO CAPS: 100.0000 mg | ORAL_CAPSULE | Freq: Three times a day (TID) | ORAL | 0 refills | Status: DC

## 2020-11-23 MED ORDER — DOXYCYCLINE HYCLATE 100 MG PO CAPS: 100.0000 mg | ORAL_CAPSULE | Freq: Two times a day (BID) | ORAL | 0 refills | Status: DC

## 2020-11-23 NOTE — ED Provider Notes (Signed)
[facility] DEPT Provider Note   CSN: [id number] Arrival date & time: 11/23/20  0132     History Chief Complaint  Patient presents with  . Cough  . Shortness of Breath    Juan Roman is a 44 y.o. male with a history of insulin-dependent diabetes mellitus and tobacco abuse who presents to the emergency department with primary complaints of cough over the past 1 week.  Patient states cough is primarily dry, did cough up some phlegm sputum a few times.  Tonight seemed worse with some shortness of breath and wheezing.  No specific alleviating or aggravating factors.  He also mentions some skin lesions to his back which have been present for 2 months but seem worse, so they have been red and have drained some purulent material.  His blood sugars typically run in the 300-400 range.  He states he has been taking his insulin.  He denies fever, nausea, vomiting, abdominal pain, chest pain, dysuria, frequency, or syncope.  HPI     Past Medical History:  Diagnosis Date  . Chronic back pain   . Diabetes mellitus without complication [facility])     Patient Active Problem List   Diagnosis Date Noted  . T2DM (type 2 diabetes mellitus) ([place]) 08/30/2020  . Cellulitis of neck 08/28/2020  . Neck abscess 08/28/2020  . Infective myositis   . Lymphadenitis     Past Surgical History:  Procedure Laterality Date  . APPENDECTOMY    . BACK SURGERY    . CHOLECYSTECTOMY    . INCISION AND DRAINAGE ABSCESS Left 08/29/2020   Procedure: INCISION AND DRAINAGE  OF POSTERIOR NECK ABSCESS;  Surgeon: Donnie Mesa, MD;  Location: [facility];  Service: General;  Laterality: Left;  . TONSILLECTOMY         No family history on file.  Social History   Tobacco Use  . Smoking status: Current Every Day Smoker    Packs/day: 1.00    Types: Cigarettes  . Smokeless tobacco: Never Used  Vaping Use  . Vaping Use: Never used  Substance Use Topics  . Alcohol use: Not Currently  . Drug  use: Yes    Types: Marijuana    Home Medications Prior to Admission medications   Medication Sig Start Date End Date Taking? Authorizing Provider  gabapentin (NEURONTIN) 400 MG capsule Take 400 mg by mouth 4 (four) times daily as needed. 08/05/20  Yes [provider]  insulin aspart protamine- aspart (NOVOLOG MIX 70/30) (70-30) 100 UNIT/ML injection Inject 0.3 mLs (30 Units total) into the skin 2 (two) times daily with a meal. Relion insulin Patient taking differently: Inject 10 Units into the skin in the morning, at noon, and at bedtime. Relion insulin 10/20/20 11/23/20 Yes Hayden Rasmussen, MD  Insulin Syringes, Disposable, U-100 0.3 ML MISC insulin syringes. 08/31/20  Yes Arrien, Jimmy Picket, MD  LANTUS 100 UNIT/ML injection SMARTSIG:50 Unit(s) SUB-Q Every Night 10/16/20  Yes [provider]  losartan (COZAAR) 100 MG tablet Take 100 mg by mouth daily. 10/16/20  Yes [provider]  NEEDLE, DISP, 14 G 14G X 1" MISC Insulin needles. 08/31/20  Yes Arrien, Jimmy Picket, MD  acetaminophen (TYLENOL) 500 MG tablet Take 1 tablet (500 mg total) by mouth every 6 (six) hours as needed for mild pain. Patient not taking: Reported on 11/23/2020 08/31/20   Arrien, Jimmy Picket, MD  blood glucose meter kit and supplies Dispense based on patient and insurance preference. Use up to four times daily as  directed. (FOR ICD-10 E10.9, E11.9). 08/31/20   Arrien, Jimmy Picket, MD  cyclobenzaprine (FLEXERIL) 10 MG tablet Take 1 tablet (10 mg total) by mouth 2 (two) times daily as needed for muscle spasms. Patient not taking: Reported on 11/23/2020 09/30/20   Lennice Sites, DO  ibuprofen (ADVIL) 400 MG tablet Take 1 tablet (400 mg total) by mouth every 6 (six) hours as needed for moderate pain. Patient not taking: Reported on 11/23/2020 08/31/20   Arrien, Jimmy Picket, MD  oxyCODONE (OXY IR/ROXICODONE) 5 MG immediate release tablet Take 1 tablet (5 mg total) by mouth every 6 (six)  hours as needed for severe pain. Patient not taking: Reported on 11/23/2020 08/31/20   Arrien, Jimmy Picket, MD    Allergies    Influenza virus vac live quad, Nsaids, Toradol [ketorolac tromethamine], and Tramadol  Review of Systems   Review of Systems  Constitutional: Negative for chills and fever.  Respiratory: Positive for cough, shortness of breath and wheezing.   Cardiovascular: Negative for chest pain and leg swelling.  Gastrointestinal: Negative for abdominal pain, blood in stool, constipation, diarrhea, nausea and vomiting.  Genitourinary: Negative for dysuria and frequency.  Skin: Positive for wound.  Neurological: Negative for syncope.  All other systems reviewed and are negative.   Physical Exam Updated Vital Signs BP 110/66   Pulse (!) 102   Temp 98.4 F (36.9 C) (Oral)   Resp (!) 25   Ht '5\' 7"'  (1.702 m)   Wt 90.7 kg   SpO2 91%   BMI 31.32 kg/m   Physical Exam Vitals and nursing note reviewed.  Constitutional:      General: He is not in acute distress.    Appearance: He is well-developed. He is not toxic-appearing.  HENT:     Head: Normocephalic and atraumatic.     Right Ear: Ear canal normal. Tympanic membrane is not perforated, erythematous, retracted or bulging.     Left Ear: Ear canal normal. Tympanic membrane is not perforated, erythematous, retracted or bulging.     Ears:     Comments: No mastoid erythema/swellng/tenderness.     Nose:     Right Sinus: No maxillary sinus tenderness or frontal sinus tenderness.     Left Sinus: No maxillary sinus tenderness or frontal sinus tenderness.     Mouth/Throat:     Pharynx: Oropharynx is clear. Uvula midline. No oropharyngeal exudate or posterior oropharyngeal erythema.     Comments: Posterior oropharynx is symmetric appearing. Patient tolerating own secretions without difficulty. No trismus. No drooling. No hot potato voice. No swelling beneath the tongue, submandibular compartment is soft.  Eyes:      General:        Right eye: No discharge.        Left eye: No discharge.     Conjunctiva/sclera: Conjunctivae normal.  Cardiovascular:     Rate and Rhythm: Regular rhythm. Tachycardia present.  Pulmonary:     Effort: Pulmonary effort is normal. No respiratory distress.     Breath sounds: Wheezing (biphasic) present. No rhonchi or rales.  Abdominal:     General: There is no distension.     Palpations: Abdomen is soft.     Tenderness: There is no abdominal tenderness.  Musculoskeletal:     Cervical back: Neck supple. No rigidity.  Lymphadenopathy:     Cervical: No cervical adenopathy.  Skin:    General: Skin is warm and dry.     Findings: No rash.     Comments: Patient has multiple scabbed  areas to his lower back as well as a few scattered to his bilateral upper extremities with mild surrounding erythema.  No purulent drainage.  No significant induration or fluctuance.  Neurological:     Mental Status: He is alert.     Comments: Clear speech.   Psychiatric:        Behavior: Behavior normal.       ED Results / Procedures / Treatments   Labs (all labs ordered are listed, but only abnormal results are displayed) Labs Reviewed  COMPREHENSIVE METABOLIC PANEL - Abnormal; Notable for the following components:      Result Value   Sodium 128 (*)    Potassium 5.5 (*)    Chloride 94 (*)    Glucose, Bld 590 (*)    BUN 21 (*)    Albumin 3.4 (*)    Alkaline Phosphatase 140 (*)    All other components within normal limits  CBC WITH DIFFERENTIAL/PLATELET - Abnormal; Notable for the following components:   WBC 16.1 (*)    Neutro Abs 9.2 (*)    Lymphs Abs 4.4 (*)    Monocytes Absolute 1.4 (*)    Abs Immature Granulocytes 0.67 (*)    All other components within normal limits  URINALYSIS, ROUTINE W REFLEX MICROSCOPIC - Abnormal; Notable for the following components:   Color, Urine COLORLESS (*)    Glucose, UA >=500 (*)    Ketones, ur 5 (*)    All other components within normal limits   CBG MONITORING, ED - Abnormal; Notable for the following components:   Glucose-Capillary 572 (*)    All other components within normal limits  CBG MONITORING, ED - Abnormal; Notable for the following components:   Glucose-Capillary 435 (*)    All other components within normal limits  RESP PANEL BY RT-PCR (RSV, FLU A&B, COVID)  [id number]    EKG None  Radiology DG Chest Portable 1 View  Result Date: 11/23/2020 CLINICAL DATA:  Cough EXAM: PORTABLE CHEST 1 VIEW COMPARISON:  08/28/2020 FINDINGS: The heart size and mediastinal contours are within normal limits. Both lungs are clear. The visualized skeletal structures are unremarkable. IMPRESSION: No active disease. Electronically Signed   By: Constance Holster M.D.   On: 11/23/2020 03:05    Procedures Procedures (including critical care time)  Medications Ordered in ED Medications  aerochamber Z-Stat Plus/medium 1 each (1 each Other Refused 11/23/20 0434)  albuterol (VENTOLIN HFA) 108 (90 Base) MCG/ACT inhaler 6 puff (6 puffs Inhalation Given 11/23/20 0343)  acetaminophen (TYLENOL) tablet 650 mg (650 mg Oral Given 11/23/20 0339)  sodium chloride 0.9 % bolus 1,000 mL (1,000 mLs Intravenous New Bag/Given 11/23/20 0339)  insulin aspart (novoLOG) injection 7 Units (7 Units Intravenous Given 11/23/20 0346)    ED Course  I have reviewed the triage vital signs and the nursing notes.  Pertinent labs & imaging results that were available during my care of the patient were reviewed by me and considered in my medical decision making (see chart for details).    MDM Rules/Calculators/A&P                         Patient presents to the ED with complaints of cough/dyspnea.  He is nontoxic, resting comfortably, he is tachycardic, vitals otherwise fairly unremarkable.  Additional history obtained:  Additional history obtained from chart review nursing note reviewed.  Lab Tests:  I Ordered, reviewed, and interpreted labs, which included:  CBC:  Leukocytosis at 16.1. CMP: Significant  hyperglycemia at 590 however anion gap and bicarb are within normal limits.  Mildly hyponatremic, hypochloremic, and hyponatremic.  Suspect patient is dehydrated.  Fluids have been ordered, insulin will also help with mild hyperkalemia.  UA with glucosuria, minimal ketones, no UTI. Covid and flu test are negative  Imaging Studies ordered:  I ordered imaging studies which included chest x-ray, I independently visualized and interpreted imaging which showed no acute process.  Suspect his respiratory symptoms are viral versus allergic, he was given an albuterol inhaler for his wheezing with lungs clear on repeat exam, he is not hypoxic, he is low risk [place], I doubt pulmonary embolism, no infiltrate on chest x-ray to suggest community-acquired pneumonia, Covid/influenza negative.  Treating supportively with albuterol inhaler and Tessalon. Tachycardia resolved- current rate on re-assessment 92 bpm, SpO2 99% on RA. In terms of his skin lesions, appear to be poor healing wounds, likely secondary to his chronic hyperglycemia. Given erythema and reported drainage w/ leukocytosis will start him on doxycycline, no purulent drainage on my exam, however given this was reported by the patient will cover for MRSA.  In terms of his hyperglycemia he was given fluids and insulin in the emergency department with mild improvement, we discussed at length the importance for PCP follow-up regarding above problems especially his hyperglycemia. I discussed results, treatment plan, need for follow-up, and return precautions with the patient. Provided opportunity for questions, patient confirmed understanding and is in agreement with plan.   Findings and plan of care discussed with supervising physician Dr. Roxanne Mins who is in agreement.   Portions of this note were generated with [employer]. Dictation errors may occur despite best attempts at proofreading.  Final Clinical  Impression(s) / ED Diagnoses Final diagnoses:  Cough  Wheezing  Hyperglycemia    Rx / DC Orders ED Discharge Orders         Ordered    benzonatate (TESSALON) 100 MG capsule  Every 8 hours        11/23/20 0629    doxycycline (VIBRAMYCIN) 100 MG capsule  2 times daily        11/23/20 0629           Amaryllis Dyke, PA-C 36/62/94 [id number]    Delora Fuel, MD 65/03/54 [phone]

## 2020-11-23 NOTE — ED Triage Notes (Signed)
Pt came in via EMS with initial c/o cough times one week and SOB that started a couple of hours ago. Pt associates SOB with coughing fits. EMS checked his CBG and it read HIGH. IV started and 150cc of fluid given

## 2020-11-23 NOTE — Discharge Instructions (Addendum)
You were seen in the emergency department tonight for cough and trouble breathing.  Your chest x-ray was normal.  Your Covid test was negative.  Your labs show that your blood sugar was significantly elevated, and your potassium, sodium, and chloride were abnormal.  We gave you fluids as well as insulin to improve these laboratory values.   We are sending you home with the following medicines: -Albuterol inhaler: Use 1 to 2 puffs every 4-6 hours as needed for shortness of breath/wheezing. -Tessalon: Take every 8 hours as needed for coughing -Doxycycline: Take twice per day for the next 1 week to cover for possible skin infection.  We have prescribed you new medication(s) today. Discuss the medications prescribed today with your pharmacist as they can have adverse effects and interactions with your other medicines including over the counter and prescribed medications. Seek medical evaluation if you start to experience new or abnormal symptoms after taking one of these medicines, seek care immediately if you start to experience difficulty breathing, feeling of your throat closing, facial swelling, or rash as these could be indications of a more serious allergic reaction  It is extremely important that you get your blood sugars under control.  We would like you to follow-up tomorrow morning with your primary care provider to discuss further blood sugar management.  Please also schedule a follow appointment within 3 days for reevaluation of your symptoms.  Return to the ER for new or worsening symptoms or any other concerns.

## 2020-12-01 ENCOUNTER — Other Ambulatory Visit: Payer: Self-pay

## 2020-12-01 ENCOUNTER — Encounter (HOSPITAL_BASED_OUTPATIENT_CLINIC_OR_DEPARTMENT_OTHER): Payer: Self-pay

## 2020-12-01 ENCOUNTER — Emergency Department (HOSPITAL_BASED_OUTPATIENT_CLINIC_OR_DEPARTMENT_OTHER)
Admission: EM | Admit: 2020-12-01 | Discharge: 2020-12-01 | Disposition: A | Discharge status: Home / Self Care | Payer: Medicaid Other | Attending: Emergency Medicine | Admitting: Emergency Medicine

## 2020-12-01 DIAGNOSIS — E119 Type 2 diabetes mellitus without complications: Secondary | ICD-10-CM | POA: Diagnosis not present

## 2020-12-01 DIAGNOSIS — I10 Essential (primary) hypertension: Secondary | ICD-10-CM | POA: Diagnosis not present

## 2020-12-01 DIAGNOSIS — Z79899 Other long term (current) drug therapy: Secondary | ICD-10-CM | POA: Insufficient documentation

## 2020-12-01 DIAGNOSIS — R6884 Jaw pain: Secondary | ICD-10-CM | POA: Insufficient documentation

## 2020-12-01 DIAGNOSIS — Z794 Long term (current) use of insulin: Secondary | ICD-10-CM | POA: Diagnosis not present

## 2020-12-01 DIAGNOSIS — K0889 Other specified disorders of teeth and supporting structures: Secondary | ICD-10-CM

## 2020-12-01 DIAGNOSIS — F17210 Nicotine dependence, cigarettes, uncomplicated: Secondary | ICD-10-CM | POA: Diagnosis not present

## 2020-12-01 LAB — CBG MONITORING, ED: Glucose-Capillary: 595 mg/dL (ref 70–99)

## 2020-12-01 MED ORDER — INSULIN REGULAR HUMAN 100 UNIT/ML IJ SOLN
12.0000 [IU] | Freq: Once | INTRAMUSCULAR | Status: DC
  Filled 2020-12-01: qty 3

## 2020-12-01 MED ORDER — AMOXICILLIN 500 MG PO CAPS: 500.0000 mg | ORAL_CAPSULE | Freq: Three times a day (TID) | ORAL | 0 refills | Status: DC

## 2020-12-01 MED ORDER — INSULIN ASPART 100 UNIT/ML ~~LOC~~ SOLN
12.0000 [IU] | Freq: Once | SUBCUTANEOUS | Status: AC
  Administered 2020-12-01: 13:00:00 12 [IU] via SUBCUTANEOUS
  Filled 2020-12-01: qty 12

## 2020-12-01 MED ORDER — DICLOFENAC SODIUM 75 MG PO TBEC: 75.0000 mg | DELAYED_RELEASE_TABLET | Freq: Two times a day (BID) | ORAL | 0 refills | Status: DC

## 2020-12-01 NOTE — ED Provider Notes (Addendum)
[facility] EMERGENCY DEPARTMENT Provider Note   CSN: [id number] Arrival date & time: 12/01/20  1013     History Chief Complaint  Patient presents with  . Jaw Pain    Juan Roman is a 44 y.o. male.  Pt complains of jaw pain.  Pt has chronic tmj but thinks he may have a dental infection.  Pt did not take his glucose today.  Rn checked his glucose and it was over 500.    The history is provided by the patient. No language interpreter was used.       Past Medical History:  Diagnosis Date  . Chronic back pain   . Diabetes mellitus without complication [facility])     Patient Active Problem List   Diagnosis Date Noted  . T2DM (type 2 diabetes mellitus) ([place]) 08/30/2020  . Cellulitis of neck 08/28/2020  . Neck abscess 08/28/2020  . Infective myositis   . Lymphadenitis     Past Surgical History:  Procedure Laterality Date  . APPENDECTOMY    . BACK SURGERY    . CHOLECYSTECTOMY    . INCISION AND DRAINAGE ABSCESS Left 08/29/2020   Procedure: INCISION AND DRAINAGE  OF POSTERIOR NECK ABSCESS;  Surgeon: Donnie Mesa, MD;  Location: [place];  Service: General;  Laterality: Left;  . TONSILLECTOMY         History reviewed. No pertinent family history.  Social History   Tobacco Use  . Smoking status: Current Every Day Smoker    Packs/day: 1.00    Types: Cigarettes  . Smokeless tobacco: Never Used  Vaping Use  . Vaping Use: Never used  Substance Use Topics  . Alcohol use: Not Currently  . Drug use: Yes    Types: Marijuana    Home Medications Prior to Admission medications   Medication Sig Start Date End Date Taking? Authorizing Provider  benzonatate (TESSALON) 100 MG capsule Take 1 capsule (100 mg total) by mouth every 8 (eight) hours. 11/23/20   Petrucelli, Samantha R, PA-C  blood glucose meter kit and supplies Dispense based on patient and insurance preference. Use up to four times daily as directed. (FOR ICD-10 E10.9, E11.9). 08/31/20   Arrien, Jimmy Picket, MD  doxycycline (VIBRAMYCIN) 100 MG capsule Take 1 capsule (100 mg total) by mouth 2 (two) times daily. 11/23/20   Petrucelli, Samantha R, PA-C  gabapentin (NEURONTIN) 400 MG capsule Take 400 mg by mouth 4 (four) times daily as needed. 08/05/20   [provider]  insulin aspart protamine- aspart (NOVOLOG MIX 70/30) (70-30) 100 UNIT/ML injection Inject 0.3 mLs (30 Units total) into the skin 2 (two) times daily with a meal. Relion insulin Patient taking differently: Inject 10 Units into the skin in the morning, at noon, and at bedtime. Relion insulin 10/20/20 11/23/20  Hayden Rasmussen, MD  Insulin Syringes, Disposable, U-100 0.3 ML MISC insulin syringes. 08/31/20   Arrien, Jimmy Picket, MD  LANTUS 100 UNIT/ML injection SMARTSIG:50 Unit(s) SUB-Q Every Night 10/16/20   [provider]  losartan (COZAAR) 100 MG tablet Take 100 mg by mouth daily. 10/16/20   [provider]  NEEDLE, DISP, 14 G 14G X 1" MISC Insulin needles. 08/31/20   Arrien, Jimmy Picket, MD  oxyCODONE (OXY IR/ROXICODONE) 5 MG immediate release tablet Take 1 tablet (5 mg total) by mouth every 6 (six) hours as needed for severe pain. Patient not taking: Reported on 11/23/2020 08/31/20   Arrien, Jimmy Picket, MD    Allergies    Influenza virus vac  live quad, Nsaids, Toradol [ketorolac tromethamine], and Tramadol  Review of Systems   Review of Systems  All other systems reviewed and are negative.   Physical Exam Updated Vital Signs BP (!) 141/97 (BP Location: Left Arm)   Pulse (!) 113   Temp 98.2 F (36.8 C) (Oral)   Resp 16   Ht _0  (1.702 m)   Wt 97.5 kg   SpO2 99%   BMI 33.67 kg/m   Physical Exam Vitals and nursing note reviewed.  Constitutional:      Appearance: He is well-developed and well-nourished.  HENT:     Head: Normocephalic.     Comments: Tender left jaw Eyes:     Extraocular Movements: EOM normal.  Cardiovascular:     Rate and Rhythm: Normal rate.  Pulmonary:      Effort: Pulmonary effort is normal.  Musculoskeletal:        General: Normal range of motion.     Cervical back: Normal range of motion.  Neurological:     General: No focal deficit present.     Mental Status: He is alert and oriented to person, place, and time.  Psychiatric:        Mood and Affect: Mood and affect and mood normal.     ED Results / Procedures / Treatments   Labs (all labs ordered are listed, but only abnormal results are displayed) Labs Reviewed - No data to display  EKG None  Radiology No results found.  Procedures Procedures (including critical care time)  Medications Ordered in ED Medications - No data to display  ED Course  I have reviewed the triage vital signs and the nursing notes.  Pertinent labs & imaging results that were available during my care of the patient were reviewed by me and considered in my medical decision making (see chart for details).    MDM Rules/Calculators/A&P                          MDM:  Pt given insulin  Rx for antibiotic and voltaren EC.  Pt counseled on the need to take his diabetes medication.  Final Clinical Impression(s) / ED Diagnoses Final diagnoses:  None    Rx / DC Orders ED Discharge Orders         Ordered    amoxicillin (AMOXIL) 500 MG capsule  3 times daily        12/01/20 1220    diclofenac (VOLTAREN) 75 MG EC tablet  2 times daily        12/01/20 1227        An After Visit Summary was printed and given to the patient.    Fransico Meadow, PA-C 12/02/20 1533    Fransico Meadow, PA-C 12/02/20 1705    Fransico Meadow, PA-C 12/02/20 [place], DO 12/02/20 Mirian Capuchin

## 2020-12-01 NOTE — ED Triage Notes (Signed)
Pt c/o L sided jaw pain after waking up from a pop in his jaw this morning. Pt states he has taken medication at home without relief. Pt arrived via EMS for jaw pain with a blood sugar of 553.

## 2020-12-01 NOTE — Discharge Instructions (Signed)
Monitor your glucose and take insulin as directed

## 2020-12-08 ENCOUNTER — Other Ambulatory Visit: Payer: Self-pay

## 2020-12-08 ENCOUNTER — Emergency Department (HOSPITAL_BASED_OUTPATIENT_CLINIC_OR_DEPARTMENT_OTHER)
Admission: EM | Admit: 2020-12-08 | Discharge: 2020-12-08 | Disposition: A | Discharge status: Home / Self Care | Payer: Medicaid Other | Attending: Emergency Medicine | Admitting: Emergency Medicine

## 2020-12-08 ENCOUNTER — Encounter (HOSPITAL_BASED_OUTPATIENT_CLINIC_OR_DEPARTMENT_OTHER): Payer: Self-pay | Admitting: Emergency Medicine

## 2020-12-08 DIAGNOSIS — M542 Cervicalgia: Secondary | ICD-10-CM | POA: Diagnosis present

## 2020-12-08 DIAGNOSIS — R200 Anesthesia of skin: Secondary | ICD-10-CM | POA: Diagnosis not present

## 2020-12-08 DIAGNOSIS — E119 Type 2 diabetes mellitus without complications: Secondary | ICD-10-CM | POA: Insufficient documentation

## 2020-12-08 DIAGNOSIS — M5412 Radiculopathy, cervical region: Secondary | ICD-10-CM | POA: Diagnosis not present

## 2020-12-08 DIAGNOSIS — F17210 Nicotine dependence, cigarettes, uncomplicated: Secondary | ICD-10-CM | POA: Insufficient documentation

## 2020-12-08 DIAGNOSIS — Z79899 Other long term (current) drug therapy: Secondary | ICD-10-CM | POA: Diagnosis not present

## 2020-12-08 DIAGNOSIS — Z794 Long term (current) use of insulin: Secondary | ICD-10-CM | POA: Insufficient documentation

## 2020-12-08 MED ORDER — GABAPENTIN 300 MG PO CAPS
300.0000 mg | ORAL_CAPSULE | Freq: Once | ORAL | Status: AC
  Administered 2020-12-08: 04:00:00 300 mg via ORAL
  Filled 2020-12-08: qty 1

## 2020-12-08 MED ORDER — CYCLOBENZAPRINE HCL 5 MG PO TABS: 5.0000 mg | ORAL_TABLET | Freq: Two times a day (BID) | ORAL | 0 refills | Status: DC | PRN

## 2020-12-08 MED ORDER — GABAPENTIN 600 MG PO TABS
300.0000 mg | ORAL_TABLET | Freq: Once | ORAL | Status: DC
  Filled 2020-12-08: qty 0.5

## 2020-12-08 MED ORDER — CYCLOBENZAPRINE HCL 5 MG PO TABS
5.0000 mg | ORAL_TABLET | Freq: Once | ORAL | Status: AC
  Administered 2020-12-08: 04:00:00 5 mg via ORAL
  Filled 2020-12-08: qty 1

## 2020-12-08 MED ORDER — GABAPENTIN 300 MG PO CAPS: 300.0000 mg | ORAL_CAPSULE | Freq: Three times a day (TID) | ORAL | 0 refills | Status: DC

## 2020-12-08 MED ORDER — NAPROXEN 250 MG PO TABS
500.0000 mg | ORAL_TABLET | Freq: Once | ORAL | Status: AC
  Administered 2020-12-08: 04:00:00 500 mg via ORAL
  Filled 2020-12-08: qty 2

## 2020-12-08 NOTE — ED Provider Notes (Signed)
[facility] EMERGENCY DEPARTMENT Provider Note   CSN: [id number] Arrival date & time:        History Chief Complaint  Patient presents with  . Neck Pain    Juan Roman is a 44 y.o. male.  HPI     This 44 year old male with history of chronic back pain, diabetes, remote history of IV drug use who presents with neck pain.  Patient reports history of chronic neck pain and "bulging disc."  He states that he is having a flare.  He describes pain in the neck that radiates into the right side of the neck and down the right arm.  He reports numbness and tingling in the arm.  Denies weakness.  Rates his pain 8 out of 10.  Has been taking ibuprofen with minimal relief.  Pain is worse with movement of the head.  He states that he has felt very stiff.  No recent fevers.  States he has not used IV drugs in the last 2 years.  Past Medical History:  Diagnosis Date  . Chronic back pain   . Diabetes mellitus without complication [facility])     Patient Active Problem List   Diagnosis Date Noted  . T2DM (type 2 diabetes mellitus) ([place]) 08/30/2020  . Cellulitis of neck 08/28/2020  . Neck abscess 08/28/2020  . Infective myositis   . Lymphadenitis     Past Surgical History:  Procedure Laterality Date  . APPENDECTOMY    . BACK SURGERY    . CHOLECYSTECTOMY    . INCISION AND DRAINAGE ABSCESS Left 08/29/2020   Procedure: INCISION AND DRAINAGE  OF POSTERIOR NECK ABSCESS;  Surgeon: Donnie Mesa, MD;  Location: [place];  Service: General;  Laterality: Left;  . KNEE ARTHROSCOPY    . TONSILLECTOMY         No family history on file.  Social History   Tobacco Use  . Smoking status: Current Every Day Smoker    Packs/day: 1.00    Types: Cigarettes  . Smokeless tobacco: Never Used  Vaping Use  . Vaping Use: Never used  Substance Use Topics  . Alcohol use: Not Currently  . Drug use: Yes    Types: Marijuana    Home Medications Prior to Admission medications   Medication Sig  Start Date End Date Taking? Authorizing Provider  amoxicillin (AMOXIL) 500 MG capsule Take 1 capsule (500 mg total) by mouth 3 (three) times daily. 12/01/20   Fransico Meadow, PA-C  benzonatate (TESSALON) 100 MG capsule Take 1 capsule (100 mg total) by mouth every 8 (eight) hours. 11/23/20   Petrucelli, Samantha R, PA-C  blood glucose meter kit and supplies Dispense based on patient and insurance preference. Use up to four times daily as directed. (FOR ICD-10 E10.9, E11.9). 08/31/20   Arrien, Jimmy Picket, MD  cyclobenzaprine (FLEXERIL) 5 MG tablet Take 1 tablet (5 mg total) by mouth 2 (two) times daily as needed for muscle spasms. 12/08/20   Kayleah Appleyard, Barbette Hair, MD  diclofenac (VOLTAREN) 75 MG EC tablet Take 1 tablet (75 mg total) by mouth 2 (two) times daily. 12/01/20   Fransico Meadow, PA-C  doxycycline (VIBRAMYCIN) 100 MG capsule Take 1 capsule (100 mg total) by mouth 2 (two) times daily. 11/23/20   Petrucelli, Samantha R, PA-C  gabapentin (NEURONTIN) 300 MG capsule Take 1 capsule (300 mg total) by mouth 3 (three) times daily. 12/08/20   Nimrit Kehres, Barbette Hair, MD  gabapentin (NEURONTIN) 400 MG capsule Take 400 mg by mouth 4 (  four) times daily as needed. 08/05/20   [provider]  insulin aspart protamine- aspart (NOVOLOG MIX 70/30) (70-30) 100 UNIT/ML injection Inject 0.3 mLs (30 Units total) into the skin 2 (two) times daily with a meal. Relion insulin Patient taking differently: Inject 10 Units into the skin in the morning, at noon, and at bedtime. Relion insulin 10/20/20 11/23/20  Hayden Rasmussen, MD  Insulin Syringes, Disposable, U-100 0.3 ML MISC insulin syringes. 08/31/20   Arrien, Jimmy Picket, MD  LANTUS 100 UNIT/ML injection SMARTSIG:50 Unit(s) SUB-Q Every Night 10/16/20   [provider]  losartan (COZAAR) 100 MG tablet Take 100 mg by mouth daily. 10/16/20   [provider]  NEEDLE, DISP, 14 G 14G X 1" MISC Insulin needles. 08/31/20   Arrien, Jimmy Picket, MD   oxyCODONE (OXY IR/ROXICODONE) 5 MG immediate release tablet Take 1 tablet (5 mg total) by mouth every 6 (six) hours as needed for severe pain. Patient not taking: Reported on 11/23/2020 08/31/20   Arrien, Jimmy Picket, MD    Allergies    Influenza virus vac live quad, Nsaids, Toradol [ketorolac tromethamine], and Tramadol  Review of Systems   Review of Systems  Constitutional: Negative for fever.  Musculoskeletal: Positive for neck pain. Negative for neck stiffness.  Neurological: Positive for numbness. Negative for weakness and headaches.  All other systems reviewed and are negative.   Physical Exam Updated Vital Signs BP (!) 144/98 (BP Location: Right Arm)   Pulse (!) 106   Temp 98.3 F (36.8 C) (Oral)   Resp 20   Ht 1.702 m ('5\' 7"' )   Wt 90.7 kg   SpO2 94%   BMI 31.32 kg/m   Physical Exam Vitals and nursing note reviewed.  Constitutional:      Appearance: He is well-developed and well-nourished. He is not ill-appearing.  HENT:     Head: Normocephalic and atraumatic.     Mouth/Throat:     Mouth: Mucous membranes are moist.  Eyes:     Pupils: Pupils are equal, round, and reactive to light.  Neck:     Comments: Tenderness to palpation mid lower C-spine, no step-off or deformity noted, right paraspinous muscle tenderness palpation, normal range of motion Cardiovascular:     Rate and Rhythm: Normal rate and regular rhythm.     Heart sounds: Normal heart sounds. No murmur heard.   Pulmonary:     Effort: Pulmonary effort is normal. No respiratory distress.     Breath sounds: Normal breath sounds. No wheezing.  Abdominal:     General: Bowel sounds are normal.     Palpations: Abdomen is soft.     Tenderness: There is no abdominal tenderness. There is no rebound.  Musculoskeletal:        General: No signs of injury or edema.     Cervical back: Normal range of motion and neck supple.  Lymphadenopathy:     Cervical: No cervical adenopathy.  Skin:    General: Skin is  warm and dry.  Neurological:     Mental Status: He is alert and oriented to person, place, and time.     Comments: 5 out of 5 strength deltoid, biceps, triceps, grip bilaterally, sensation objectively intact  Psychiatric:        Mood and Affect: Mood and affect and mood normal.     ED Results / Procedures / Treatments   Labs (all labs ordered are listed, but only abnormal results are displayed) Labs Reviewed - No data to display  EKG None  Radiology No results found.  Procedures Procedures (including critical care time)  Medications Ordered in ED Medications  cyclobenzaprine (FLEXERIL) tablet 5 mg (has no administration in time range)  naproxen (NAPROSYN) tablet 500 mg (has no administration in time range)  gabapentin (NEURONTIN) tablet 300 mg (has no administration in time range)    ED Course  I have reviewed the triage vital signs and the nursing notes.  Pertinent labs & imaging results that were available during my care of the patient were reviewed by me and considered in my medical decision making (see chart for details).    MDM Rules/Calculators/A&P                          Patient presents with worsening neck pain.  Reports chronic neck pain but worsening and now with radicular symptoms.  He is overall nontoxic and vital signs are notable for mild tachycardia at 106.  EMS reported blood sugars in the 300s.  Patient reports that this is "much better for me.  He reports compliance with his insulin.  Regarding his neck pain, no red flags.  He does report some paresthesias and tingling but objectively his neurologic exam is intact and he has good strength.  He is not a good candidate for steroids given his blood sugars.  Will start on scheduled anti-inflammatories.  He was also given a muscle relaxant and will start on gabapentin for likely nerve pain.  No indication for imaging as this is a chronic problem.  Doubt fracture.  No signs or symptoms of emergent cord  impingement.  After history, exam, and medical workup I feel the patient has been appropriately medically screened and is safe for discharge home. Pertinent diagnoses were discussed with the patient. Patient was given return precautions.  Final Clinical Impression(s) / ED Diagnoses Final diagnoses:  Cervical radiculopathy    Rx / DC Orders ED Discharge Orders         Ordered    gabapentin (NEURONTIN) 300 MG capsule  3 times daily        12/08/20 0349    cyclobenzaprine (FLEXERIL) 5 MG tablet  2 times daily PRN        12/08/20 0349           Merryl Hacker, MD 12/08/20 [phone]

## 2020-12-08 NOTE — ED Triage Notes (Signed)
Brought in by [facility] with R sided neck pain (chronic) and nausea. He rates the pain 8/10. Ambulatory on arrival. CBG 348 with EMS but "normal for him is in the 500s so this is low".

## 2020-12-08 NOTE — ED Notes (Signed)
EDP at bedside  

## 2020-12-08 NOTE — Discharge Instructions (Addendum)
You were seen today for neck pain.  Continue ibuprofen at home.  Start gabapentin and you will be given a short course of Flexeril.  Do not drive while taking Flexeril.

## 2020-12-09 ENCOUNTER — Encounter (HOSPITAL_COMMUNITY): Payer: Self-pay

## 2020-12-09 ENCOUNTER — Ambulatory Visit (HOSPITAL_COMMUNITY)
Admission: EM | Admit: 2020-12-09 | Discharge: 2020-12-11 | Disposition: A | Discharge status: Home / Self Care | Payer: Medicaid Other | Attending: Psychiatry | Admitting: Psychiatry

## 2020-12-09 ENCOUNTER — Other Ambulatory Visit: Payer: Self-pay

## 2020-12-09 DIAGNOSIS — F17210 Nicotine dependence, cigarettes, uncomplicated: Secondary | ICD-10-CM | POA: Insufficient documentation

## 2020-12-09 DIAGNOSIS — F419 Anxiety disorder, unspecified: Secondary | ICD-10-CM | POA: Insufficient documentation

## 2020-12-09 DIAGNOSIS — Z79899 Other long term (current) drug therapy: Secondary | ICD-10-CM | POA: Insufficient documentation

## 2020-12-09 DIAGNOSIS — Z20822 Contact with and (suspected) exposure to covid-19: Secondary | ICD-10-CM | POA: Diagnosis not present

## 2020-12-09 DIAGNOSIS — F332 Major depressive disorder, recurrent severe without psychotic features: Secondary | ICD-10-CM | POA: Diagnosis not present

## 2020-12-09 DIAGNOSIS — F1911 Other psychoactive substance abuse, in remission: Secondary | ICD-10-CM | POA: Diagnosis present

## 2020-12-09 DIAGNOSIS — R45851 Suicidal ideations: Secondary | ICD-10-CM

## 2020-12-09 DIAGNOSIS — R4587 Impulsiveness: Secondary | ICD-10-CM | POA: Insufficient documentation

## 2020-12-09 HISTORY — DX: Anxiety disorder, unspecified: F41.9

## 2020-12-09 HISTORY — DX: Post-traumatic stress disorder, unspecified: F43.10

## 2020-12-09 HISTORY — DX: Personal history of other mental and behavioral disorders: Z86.59

## 2020-12-09 HISTORY — DX: Depression, unspecified: F32.A

## 2020-12-09 HISTORY — DX: Type 2 diabetes mellitus without complications: E11.9

## 2020-12-09 LAB — CBC WITH DIFFERENTIAL/PLATELET
Abs Immature Granulocytes: 0.26 10*3/uL — ABNORMAL HIGH (ref 0.00–0.07)
Basophils Absolute: 0.1 10*3/uL (ref 0.0–0.1)
Basophils Relative: 1 %
Eosinophils Absolute: 0.1 10*3/uL (ref 0.0–0.5)
Eosinophils Relative: 1 %
HCT: 52.1 % — ABNORMAL HIGH (ref 39.0–52.0)
Hemoglobin: 18.1 g/dL — ABNORMAL HIGH (ref 13.0–17.0)
Immature Granulocytes: 1 %
Lymphocytes Relative: 18 %
Lymphs Abs: 3.4 10*3/uL (ref 0.7–4.0)
MCH: 29.7 pg (ref 26.0–34.0)
MCHC: 34.7 g/dL (ref 30.0–36.0)
MCV: 85.4 fL (ref 80.0–100.0)
Monocytes Absolute: 1 10*3/uL (ref 0.1–1.0)
Monocytes Relative: 5 %
Neutro Abs: 14 10*3/uL — ABNORMAL HIGH (ref 1.7–7.7)
Neutrophils Relative %: 74 %
Platelets: 295 10*3/uL (ref 150–400)
RBC: 6.1 MIL/uL — ABNORMAL HIGH (ref 4.22–5.81)
RDW: 13.2 % (ref 11.5–15.5)
WBC: 18.9 10*3/uL — ABNORMAL HIGH (ref 4.0–10.5)
nRBC: 0 % (ref 0.0–0.2)

## 2020-12-09 LAB — LIPID PANEL
Cholesterol: 218 mg/dL — ABNORMAL HIGH (ref 0–200)
HDL: 39 mg/dL — ABNORMAL LOW (ref >40)
LDL Cholesterol: UNDETERMINED mg/dL (ref 0–99)
Total CHOL/HDL Ratio: 5.6 RATIO
Triglycerides: 422 mg/dL — ABNORMAL HIGH (ref <150)
VLDL: UNDETERMINED mg/dL (ref 0–40)

## 2020-12-09 LAB — COMPREHENSIVE METABOLIC PANEL
ALT: 30 U/L (ref 0–44)
AST: 19 U/L (ref 15–41)
Albumin: 4.3 g/dL (ref 3.5–5.0)
Alkaline Phosphatase: 126 U/L (ref 38–126)
Anion gap: 13 (ref 5–15)
BUN: 7 mg/dL (ref 6–20)
CO2: 22 mmol/L (ref 22–32)
Calcium: 10.1 mg/dL (ref 8.9–10.3)
Chloride: 96 mmol/L — ABNORMAL LOW (ref 98–111)
Creatinine, Ser: 0.66 mg/dL (ref 0.61–1.24)
GFR, Estimated: >60 mL/min (ref >60)
Glucose, Bld: 409 mg/dL — ABNORMAL HIGH (ref 70–99)
Potassium: 4 mmol/L (ref 3.5–5.1)
Sodium: 131 mmol/L — ABNORMAL LOW (ref 135–145)
Total Bilirubin: 0.9 mg/dL (ref 0.3–1.2)
Total Protein: 8.3 g/dL — ABNORMAL HIGH (ref 6.5–8.1)

## 2020-12-09 LAB — URINALYSIS, ROUTINE W REFLEX MICROSCOPIC
Bacteria, UA: NONE SEEN
Bilirubin Urine: NEGATIVE
Glucose, UA: >=500 mg/dL — AB
Hgb urine dipstick: NEGATIVE
Ketones, ur: NEGATIVE mg/dL
Leukocytes,Ua: NEGATIVE
Nitrite: NEGATIVE
Protein, ur: 100 mg/dL — AB
Specific Gravity, Urine: 1.036 — ABNORMAL HIGH (ref 1.005–1.030)
pH: 5 (ref 5.0–8.0)

## 2020-12-09 LAB — POCT URINE DRUG SCREEN - MANUAL ENTRY (I-SCREEN)
POC Amphetamine UR: NOT DETECTED
POC Buprenorphine (BUP): NOT DETECTED
POC Cocaine UR: NOT DETECTED
POC Marijuana UR: POSITIVE — AB
POC Methadone UR: NOT DETECTED
POC Methamphetamine UR: NOT DETECTED
POC Morphine: NOT DETECTED
POC Oxazepam (BZO): NOT DETECTED
POC Oxycodone UR: NOT DETECTED
POC Secobarbital (BAR): NOT DETECTED

## 2020-12-09 LAB — HEMOGLOBIN A1C
Hgb A1c MFr Bld: 12.3 % — ABNORMAL HIGH (ref 4.8–5.6)
Mean Plasma Glucose: 306.31 mg/dL

## 2020-12-09 LAB — MAGNESIUM: Magnesium: 1.9 mg/dL (ref 1.7–2.4)

## 2020-12-09 LAB — GLUCOSE, CAPILLARY
Glucose-Capillary: 333 mg/dL — ABNORMAL HIGH (ref 70–99)
Glucose-Capillary: 314 mg/dL — ABNORMAL HIGH (ref 70–99)

## 2020-12-09 LAB — POC SARS CORONAVIRUS 2 AG: SARS Coronavirus 2 Ag: NEGATIVE

## 2020-12-09 LAB — ETHANOL: Alcohol, Ethyl (B): <10 mg/dL (ref <10)

## 2020-12-09 LAB — LDL CHOLESTEROL, DIRECT: Direct LDL: 134.3 mg/dL — ABNORMAL HIGH (ref 0–99)

## 2020-12-09 LAB — TSH: TSH: 1.782 u[IU]/mL (ref 0.350–4.500)

## 2020-12-09 LAB — RESP PANEL BY RT-PCR (FLU A&B, COVID) ARPGX2
Influenza A by PCR: NEGATIVE
Influenza B by PCR: NEGATIVE
SARS Coronavirus 2 by RT PCR: NEGATIVE

## 2020-12-09 MED ORDER — NICOTINE 21 MG/24HR TD PT24
21.0000 mg | MEDICATED_PATCH | Freq: Every day | TRANSDERMAL | Status: DC
  Administered 2020-12-09 – 2020-12-10 (×2): 21 mg via TRANSDERMAL
  Filled 2020-12-09 (×2): qty 1

## 2020-12-09 MED ORDER — HYDROXYZINE HCL 25 MG PO TABS: 25.0000 mg | ORAL_TABLET | Freq: Three times a day (TID) | ORAL | Status: DC | PRN

## 2020-12-09 MED ORDER — TRAZODONE HCL 50 MG PO TABS: 50.0000 mg | ORAL_TABLET | Freq: Every evening | ORAL | Status: DC | PRN

## 2020-12-09 MED ORDER — INSULIN ASPART 100 UNIT/ML ~~LOC~~ SOLN
10.0000 [IU] | Freq: Three times a day (TID) | SUBCUTANEOUS | Status: DC
  Administered 2020-12-09 – 2020-12-10 (×2): 10 [IU] via SUBCUTANEOUS

## 2020-12-09 MED ORDER — ACETAMINOPHEN 325 MG PO TABS
650.0000 mg | ORAL_TABLET | Freq: Four times a day (QID) | ORAL | Status: DC | PRN
  Administered 2020-12-10 (×2): 650 mg via ORAL
  Filled 2020-12-09 (×2): qty 2

## 2020-12-09 MED ORDER — GABAPENTIN 100 MG PO CAPS
100.0000 mg | ORAL_CAPSULE | Freq: Three times a day (TID) | ORAL | Status: DC
  Filled 2020-12-09 (×2): qty 1

## 2020-12-09 MED ORDER — QUETIAPINE FUMARATE ER 50 MG PO TB24: 100.0000 mg | ORAL_TABLET | Freq: Every day | ORAL | Status: DC

## 2020-12-09 MED ORDER — INSULIN GLARGINE 100 UNIT/ML ~~LOC~~ SOLN
50.0000 [IU] | Freq: Every day | SUBCUTANEOUS | Status: DC
  Administered 2020-12-09: 21:00:00 50 [IU] via SUBCUTANEOUS

## 2020-12-09 MED ORDER — LOSARTAN POTASSIUM 50 MG PO TABS
100.0000 mg | ORAL_TABLET | Freq: Every day | ORAL | Status: DC
  Administered 2020-12-10: 10:00:00 100 mg via ORAL
  Filled 2020-12-09 (×2): qty 2

## 2020-12-09 NOTE — ED Notes (Signed)
Pt A&O x 4, no distress noted, calm & cooperative. Monitoring for safety. Resting at present.

## 2020-12-09 NOTE — ED Triage Notes (Addendum)
Received Juan Roman at the [facility] alone with a chief compliant of suicidal and depression.His plan was to jump off a bridge. He verbalized not feeling safe to be alone at this time. His depression increased after his fiancee died and the holidays arrived. Kris Mouton- sister is the next of kin contact person, her number is [phone].

## 2020-12-09 NOTE — ED Provider Notes (Signed)
Behavioral Health Admission H&P [facility])  Date: 12/09/20 Patient Name: Juan Roman MRN: [id number] Chief Complaint:  Chief Complaint  Patient presents with  . Suicidal    Depression increased after 08-22-23 when fiancee died. Suicidal started a couple of months ago and increased in the past several days.   Chief Complaint/Presenting Problem: Depression, anxiety, PTSD, BPD and "lifelong passive SI"  Diagnoses:  Final diagnoses:  History of substance abuse ([place])  Suicidal ideation  MDD (major depressive disorder), recurrent severe, without psychosis ([place])    HPI: Juan Roman, 44 y.o., male patient presents to [facility] as a walk in with complaints of suicidal ideation and plan to jump off bridge.  Patient seen face to face by this provider, consulted with Dr. Serafina Mitchell; and chart reviewed on 12/09/20.  On evaluation Juan Roman reports he has had worsening depression and suicidal thoughts since 08/21/2020 when his girlfriend died in his arm.  States that it has gotten even worse with the holidays coming up.  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  States he has a history of meth use but hasn't done any in 2 years.  Patient reports he has a psychiatric history of depression, anxiety, and PTSD.  Patient became upset when he thought he was being discharged with medication and outpatient services stating "I need more than some medication.  I'm telling you now if I walk out of here now I'm going to the nearest bridge and jump off.  I'm tired and I just give up."  Patient states he is on disability.  Patient states he does not have any support and states all of his problems started "In 1979 when I was born into a bucket of shit."   During evaluation Juan Roman is alert/oriented x 4; calm/cooperative; but irritable.  His mood is congruent with affect.  He does not appear to be  responding to internal/external stimuli or delusional thoughts.  Patient denies homicidal ideation, psychosis, and paranoia; but continues to endorse suicidal ideation with a plan and self harm by picking skin.  Patient answered question appropriately.    PHQ 2-9:  [place] [facility] from 12/09/2020 in [facility]  Thoughts that you would be better off dead, or of hurting yourself in some way Nearly every day  [Phreesia 12/09/2020]  PHQ-9 Total Score 27      [place] ED from 12/09/2020 in [place] CATEGORY High Risk       Total Time spent with patient: 45 minutes  Musculoskeletal  Strength & Muscle Tone: within normal limits Gait & Station: normal Patient leans: N/A  Psychiatric Specialty Exam  Presentation General Appearance: Appropriate for Environment; Casual  Eye Contact:Good  Speech:Clear and Coherent; Normal Rate  Speech Volume:Normal  Handedness:Right   Mood and Affect  Mood:Anxious; Depressed  Affect:Depressed   Thought Process  Thought Processes:Coherent; Goal Directed  Descriptions of Associations:Intact  Orientation:Full (Time, Place and Person)  Thought Content:WDL  Hallucinations:Hallucinations: None  Ideas of Reference:None  Suicidal Thoughts:Suicidal Thoughts: Yes, Active SI Active Intent and/or Plan: With Intent; With Plan; With Means to Carry Out; With Access to Means (Patient states he is going to the nearst bridge and jump off)  Homicidal Thoughts:Homicidal Thoughts: No   Sensorium  Memory:Immediate Good; Recent Good; Remote Fair  Judgment:Intact  Insight:Fair; Present  Executive Functions  Concentration:Good  Attention Span:Good  [place]  Language:Good   Psychomotor Activity  Psychomotor Activity:Psychomotor Activity: Normal   Assets  Assets:Communication Skills; Desire for Improvement   Sleep  Sleep:Sleep:  Good   Physical Exam Vitals and nursing note reviewed.  Constitutional:      Appearance: He is well-developed and well-nourished.  HENT:     Head: Normocephalic and atraumatic.  Eyes:     Conjunctiva/sclera: Conjunctivae normal.  Cardiovascular:     Rate and Rhythm: Normal rate and regular rhythm.     Heart sounds: No murmur heard.   Pulmonary:     Effort: Pulmonary effort is normal. No respiratory distress.     Breath sounds: Normal breath sounds.  Abdominal:     Palpations: Abdomen is soft.     Tenderness: There is no abdominal tenderness.  Musculoskeletal:        General: No edema. Normal range of motion.     Cervical back: Neck supple.  Skin:    General: Skin is warm and dry.     Comments: Patient does have 3 ares below left elbow where he has picked; reporting self harm.  No noted sign/symptom of infection.   Neurological:     Mental Status: He is alert and oriented to person, place, and time.  Psychiatric:        Attention and Perception: Attention and perception normal. He does not perceive auditory or visual hallucinations.        Mood and Affect: Mood is anxious and depressed. Affect is angry.        Speech: Speech normal.        Behavior: Behavior is agitated. Behavior is cooperative.        Thought Content: Thought content is not paranoid or delusional. Thought content includes suicidal ideation. Thought content does not include homicidal ideation. Thought content includes suicidal plan.        Cognition and Memory: Cognition and memory normal.        Judgment: Judgment is impulsive.    Review of Systems  Constitutional: Negative.   HENT: Negative.   Eyes: Negative.   Respiratory: Negative.   Cardiovascular: Negative.   Gastrointestinal: Negative.   Genitourinary: Negative.   Musculoskeletal: Negative.   Skin:       Reports picks skin for self harm.  Has 3 areas below left elbow where he has been picking.  No noted infection.    Neurological: Negative.    Endo/Heme/Allergies: Negative.     Blood pressure (!) 152/101, pulse (!) 118, temperature 97.8 F (36.6 C), temperature source Tympanic, SpO2 98 %. There is no height or weight on file to calculate BMI.  Past Psychiatric History: Anxiety, Depression, PTSD   Is the patient at risk to self? Yes  Has the patient been a risk to self in the past 6 months? No .    Has the patient been a risk to self within the distant past? Yes   Is the patient a risk to others? No   Has the patient been a risk to others in the past 6 months? No   Has the patient been a risk to others within the distant past? No   Past Medical History:  Past Medical History:  Diagnosis Date  . Anxiety   . Chronic back pain   . Depression   . Diabetes mellitus without complication ([place])   . Diabetes mellitus, type II ([place])   . History of borderline  personality disorder   . PTSD (post-traumatic stress disorder)     Past Surgical History:  Procedure Laterality Date  . APPENDECTOMY    . BACK SURGERY    . CHOLECYSTECTOMY    . INCISION AND DRAINAGE ABSCESS Left 08/29/2020   Procedure: INCISION AND DRAINAGE  OF POSTERIOR NECK ABSCESS;  Surgeon: Donnie Mesa, MD;  Location: [facility];  Service: General;  Laterality: Left;  . KNEE ARTHROSCOPY    . TONSILLECTOMY      Family History: History reviewed. No pertinent family history.  Social History:  Social History   Socioeconomic History  . Marital status: Widowed    Spouse name: Not on file  . Number of children: 0  . Years of education: Not on file  . Highest education level: 9th grade  Occupational History  . Occupation: Social Security  Tobacco Use  . Smoking status: Current Every Day Smoker    Packs/day: 0.25    Years: 28.00    Pack years: 7.00    Types: Cigarettes  . Smokeless tobacco: Never Used  Vaping Use  . Vaping Use: Some days  . Substances: Nicotine, Flavoring  Substance and Sexual Activity  . Alcohol use: Not Currently    Comment: once a year   . Drug use: Not Currently    Types: Marijuana    Comment: 3-4 days ago  . Sexual activity: Not Currently  Other Topics Concern  . Not on file  Social History Narrative  . Not on file   Social Determinants of Health   Financial Resource Strain: Not on file  Food Insecurity: Not on file  Transportation Needs: Not on file  Physical Activity: Not on file  Stress: Not on file  Social Connections: Not on file  Intimate Partner Violence: Not on file    SDOH:  SDOH Screenings   Alcohol Screen: Not on file  Depression (PHQ2-9): Medium Risk  . PHQ-2 Score: 27  Financial Resource Strain: Not on file  Food Insecurity: Not on file  Housing: Not on file  Physical Activity: Not on file  Social Connections: Not on file  Stress: Not on file  Tobacco Use: High Risk  . Smoking Tobacco Use: Current Every Day Smoker  . Smokeless Tobacco Use: Never Used  Transportation Needs: Not on file    Last Labs:  Admission on 12/01/2020, Discharged on 12/01/2020  Component Date Value Ref Range Status  . Glucose-Capillary 12/01/2020 595* 70 - 99 mg/dL Final   Glucose reference range applies only to samples taken after fasting for at least 8 hours.  . Comment 1 12/01/2020 Notify RN   Final  Admission on 11/23/2020, Discharged on 11/23/2020  Component Date Value Ref Range Status  . Glucose-Capillary 11/23/2020 572* 70 - 99 mg/dL Final   Glucose reference range applies only to samples taken after fasting for at least 8 hours.  Marland Kitchen SARS Coronavirus 2 by RT PCR 11/23/2020 NEGATIVE  NEGATIVE Final   Comment: (NOTE) SARS-CoV-2 target nucleic acids are NOT DETECTED.  The SARS-CoV-2 RNA is generally detectable in upper respiratory specimens during the acute phase of infection. The lowest concentration of SARS-CoV-2 viral copies this assay can detect is 138 copies/mL. A negative result does not preclude SARS-Cov-2 infection and should not be used as the sole basis for treatment or other patient  management decisions. A negative result may occur with  improper specimen collection/handling, submission of specimen other than nasopharyngeal swab, presence of viral mutation(s) within the areas targeted by this assay, and inadequate  number of viral copies(<138 copies/mL). A negative result must be combined with clinical observations, patient history, and epidemiological information. The expected result is Negative.  Fact Sheet for Patients:  EntrepreneurPulse.com.au  Fact Sheet for Healthcare Providers:  IncredibleEmployment.be  This test is no                          t yet approved or cleared by the [place] FDA and  has been authorized for detection and/or diagnosis of SARS-CoV-2 by FDA under an Emergency Use Authorization (EUA). This EUA will remain  in effect (meaning this test can be used) for the duration of the COVID-19 declaration under Section 564(b)(1) of the Act, 21 U.S.C.section 360bbb-3(b)(1), unless the authorization is terminated  or revoked sooner.      . Influenza A by PCR 11/23/2020 NEGATIVE  NEGATIVE Final  . Influenza B by PCR 11/23/2020 NEGATIVE  NEGATIVE Final   Comment: (NOTE) The Xpert Xpress SARS-CoV-2/FLU/RSV plus assay is intended as an aid in the diagnosis of influenza from Nasopharyngeal swab specimens and should not be used as a sole basis for treatment. Nasal washings and aspirates are unacceptable for Xpert Xpress SARS-CoV-2/FLU/RSV testing.  Fact Sheet for Patients: EntrepreneurPulse.com.au  Fact Sheet for Healthcare Providers: IncredibleEmployment.be  This test is not yet approved or cleared by the [place] FDA and has been authorized for detection and/or diagnosis of SARS-CoV-2 by FDA under an Emergency Use Authorization (EUA). This EUA will remain in effect (meaning this test can be used) for the duration of the COVID-19 declaration under Section  564(b)(1) of the Act, 21 U.S.C. section 360bbb-3(b)(1), unless the authorization is terminated or revoked.    Marland Kitchen Resp Syncytial Virus by PCR 11/23/2020 NEGATIVE  NEGATIVE Final   Comment: (NOTE) Fact Sheet for Patients: EntrepreneurPulse.com.au  Fact Sheet for Healthcare Providers: IncredibleEmployment.be  This test is not yet approved or cleared by the [place] FDA and has been authorized for detection and/or diagnosis of SARS-CoV-2 by FDA under an Emergency Use Authorization (EUA). This EUA will remain in effect (meaning this test can be used) for the duration of the COVID-19 declaration under Section 564(b)(1) of the Act, 21 U.S.C. section 360bbb-3(b)(1), unless the authorization is terminated or revoked.  Performed at [facility], [place] [address], [place], [place] [address]   . Sodium 11/23/2020 128* 135 - 145 mmol/L Final  . Potassium 11/23/2020 5.5* 3.5 - 5.1 mmol/L Final  . Chloride 11/23/2020 94* 98 - 111 mmol/L Final  . CO2 11/23/2020 24  22 - 32 mmol/L Final  . Glucose, Bld 11/23/2020 590* 70 - 99 mg/dL Final   Comment: CRITICAL RESULT CALLED TO, READ BACK BY AND VERIFIED WITH: JAKE TALKINGTON,RN 11/23/20_0  BY P.HENDERSON   . BUN 11/23/2020 21* 6 - 20 mg/dL Final  . Creatinine, Ser 11/23/2020 0.92  0.61 - 1.24 mg/dL Final  . Calcium 11/23/2020 9.5  8.9 - 10.3 mg/dL Final  . Total Protein 11/23/2020 6.8  6.5 - 8.1 g/dL Final  . Albumin 11/23/2020 3.4* 3.5 - 5.0 g/dL Final  . AST 11/23/2020 30  15 - 41 U/L Final  . ALT 11/23/2020 22  0 - 44 U/L Final  . Alkaline Phosphatase 11/23/2020 140* 38 - 126 U/L Final  . Total Bilirubin 11/23/2020 1.1  0.3 - 1.2 mg/dL Final  . GFR, Estimated 11/23/2020 >60  >60 mL/min Final   Comment: (NOTE) Calculated using the CKD-EPI Creatinine Equation (2021)   . Anion gap  11/23/2020 10  5 - 15 Final   Performed at [facility], [place] [address]  [place], [place] [address]  . WBC 11/23/2020 16.1* 4.0 - 10.5 K/uL Final  . RBC 11/23/2020 5.22  4.22 - 5.81 MIL/uL Final  . Hemoglobin 11/23/2020 15.3  13.0 - 17.0 g/dL Final  . HCT 11/23/2020 46.0  39.0 - 52.0 % Final  . MCV 11/23/2020 88.1  80.0 - 100.0 fL Final  . MCH 11/23/2020 29.3  26.0 - 34.0 pg Final  . MCHC 11/23/2020 33.3  30.0 - 36.0 g/dL Final  . RDW 11/23/2020 13.2  11.5 - 15.5 % Final  . Platelets 11/23/2020 265  150 - 400 K/uL Final  . nRBC 11/23/2020 0.0  0.0 - 0.2 % Final  . Neutrophils Relative % 11/23/2020 57  % Final  . Neutro Abs 11/23/2020 9.2* 1.7 - 7.7 K/uL Final  . Lymphocytes Relative 11/23/2020 27  % Final  . Lymphs Abs 11/23/2020 4.4* 0.7 - 4.0 K/uL Final  . Monocytes Relative 11/23/2020 9  % Final  . Monocytes Absolute 11/23/2020 1.4* 0.1 - 1.0 K/uL Final  . Eosinophils Relative 11/23/2020 3  % Final  . Eosinophils Absolute 11/23/2020 0.4  0.0 - 0.5 K/uL Final  . Basophils Relative 11/23/2020 0  % Final  . Basophils Absolute 11/23/2020 0.1  0.0 - 0.1 K/uL Final  . Immature Granulocytes 11/23/2020 4  % Final  . Abs Immature Granulocytes 11/23/2020 0.67* 0.00 - 0.07 K/uL Final  . Reactive, Benign Lymphocytes 11/23/2020 PRESENT   Final   Performed at [facility], [place] [address] [place], [place] [address]  . Color, Urine 11/23/2020 COLORLESS* YELLOW Final  . APPearance 11/23/2020 CLEAR  CLEAR Final  . Specific Gravity, Urine 11/23/2020 1.027  1.005 - 1.030 Final  . pH 11/23/2020 6.0  5.0 - 8.0 Final  . Glucose, UA 11/23/2020 >=500* NEGATIVE mg/dL Final  . Hgb urine dipstick 11/23/2020 NEGATIVE  NEGATIVE Final  . Bilirubin Urine 11/23/2020 NEGATIVE  NEGATIVE Final  . Ketones, ur 11/23/2020 5* NEGATIVE mg/dL Final  . Protein, ur 11/23/2020 NEGATIVE  NEGATIVE mg/dL Final  . Nitrite 11/23/2020 NEGATIVE  NEGATIVE Final  . Chalmers Guest 11/23/2020 NEGATIVE  NEGATIVE Final  . RBC / HPF 11/23/2020 0-5  0 - 5 RBC/hpf Final  . Bacteria, UA  11/23/2020 NONE SEEN  NONE SEEN Final   Performed at [facility], [place] [address] [place], [place] [address]  . Glucose-Capillary 11/23/2020 435* 70 - 99 mg/dL Final   Glucose reference range applies only to samples taken after fasting for at least 8 hours.  . Glucose-Capillary 11/23/2020 301* 70 - 99 mg/dL Final   Glucose reference range applies only to samples taken after fasting for at least 8 hours.  . Comment 1 11/23/2020 Glucose Stabilizer   Final  Admission on 10/20/2020, Discharged on 10/20/2020  Component Date Value Ref Range Status  . Glucose-Capillary 10/20/2020 526* 70 - 99 mg/dL Final   Glucose reference range applies only to samples taken after fasting for at least 8 hours.  . Comment 1 10/20/2020 Notify RN   Final  . Comment 2 10/20/2020 Document in Chart   Final  . WBC 10/20/2020 14.6* 4.0 - 10.5 K/uL Final  . RBC 10/20/2020 6.02* 4.22 - 5.81 MIL/uL Final  . Hemoglobin 10/20/2020 17.6* 13.0 - 17.0 g/dL Final  . HCT 10/20/2020 52.1* 39.0 - 52.0 % Final  . MCV 10/20/2020 86.5  80.0 - 100.0 fL Final  .  MCH 10/20/2020 29.2  26.0 - 34.0 pg Final  . MCHC 10/20/2020 33.8  30.0 - 36.0 g/dL Final  . RDW 10/20/2020 13.7  11.5 - 15.5 % Final  . Platelets 10/20/2020 271  150 - 400 K/uL Final  . nRBC 10/20/2020 0.0  0.0 - 0.2 % Final   Performed at [facility], [address] [place], [place] [address]  . Color, Urine 10/20/2020 YELLOW  YELLOW Final  . APPearance 10/20/2020 CLEAR  CLEAR Final  . Specific Gravity, Urine 10/20/2020 1.010  1.005 - 1.030 Final  . pH 10/20/2020 5.0  5.0 - 8.0 Final  . Glucose, UA 10/20/2020 >=500* NEGATIVE mg/dL Final  . Hgb urine dipstick 10/20/2020 NEGATIVE  NEGATIVE Final  . Bilirubin Urine 10/20/2020 NEGATIVE  NEGATIVE Final  . Ketones, ur 10/20/2020 NEGATIVE  NEGATIVE mg/dL Final  . Protein, ur 10/20/2020 NEGATIVE  NEGATIVE mg/dL Final  . Nitrite 10/20/2020 NEGATIVE  NEGATIVE Final  . Chalmers Guest 10/20/2020  NEGATIVE  NEGATIVE Final   Performed at [facility], [address] [place], [place] [address]  . Glucose-Capillary 10/20/2020 329* 70 - 99 mg/dL Final   Glucose reference range applies only to samples taken after fasting for at least 8 hours.  . Sodium 10/20/2020 132* 135 - 145 mmol/L Final  . Potassium 10/20/2020 4.2  3.5 - 5.1 mmol/L Final  . Chloride 10/20/2020 98  98 - 111 mmol/L Final  . CO2 10/20/2020 22  22 - 32 mmol/L Final  . Glucose, Bld 10/20/2020 483* 70 - 99 mg/dL Final   Glucose reference range applies only to samples taken after fasting for at least 8 hours.  . BUN 10/20/2020 15  6 - 20 mg/dL Final  . Creatinine, Ser 10/20/2020 0.79  0.61 - 1.24 mg/dL Final  . Calcium 10/20/2020 9.6  8.9 - 10.3 mg/dL Final  . Total Protein 10/20/2020 9.2* 6.5 - 8.1 g/dL Final  . Albumin 10/20/2020 4.7  3.5 - 5.0 g/dL Final  . AST 10/20/2020 22  15 - 41 U/L Final  . ALT 10/20/2020 30  0 - 44 U/L Final  . Alkaline Phosphatase 10/20/2020 125  38 - 126 U/L Final  . Total Bilirubin 10/20/2020 0.7  0.3 - 1.2 mg/dL Final  . GFR, Estimated 10/20/2020 >60  >60 mL/min Final   Comment: (NOTE) Calculated using the CKD-EPI Creatinine Equation (2021)   . Anion gap 10/20/2020 12  5 - 15 Final   Performed at [facility], [address] [place], [place] [address]  . RBC / HPF 10/20/2020 NONE SEEN  0 - 5 RBC/hpf Final  . WBC, UA 10/20/2020 NONE SEEN  0 - 5 WBC/hpf Final  . Bacteria, UA 10/20/2020 RARE* NONE SEEN Final  . Squamous Epithelial / LPF 10/20/2020 0-5  0 - 5 Final   Performed at [facility], [address] [place], [place] [address]  Admission on 10/14/2020, Discharged on 10/14/2020  Component Date Value Ref Range Status  . Sodium 10/14/2020 134* 135 - 145 mmol/L Final  . Potassium 10/14/2020 3.9  3.5 - 5.1 mmol/L Final  . Chloride 10/14/2020 101  98 - 111 mmol/L Final  . CO2 10/14/2020 23  22 - 32 mmol/L Final  . Glucose, Bld 10/14/2020 403* 70 - 99  mg/dL Final   Glucose reference range applies only to samples taken after fasting for at least 8 hours.  . BUN 10/14/2020 12  6 - 20 mg/dL Final  .  Creatinine, Ser 10/14/2020 0.48* 0.61 - 1.24 mg/dL Final  . Calcium 10/14/2020 9.5  8.9 - 10.3 mg/dL Final  . Total Protein 10/14/2020 7.5  6.5 - 8.1 g/dL Final  . Albumin 10/14/2020 4.0  3.5 - 5.0 g/dL Final  . AST 10/14/2020 16  15 - 41 U/L Final  . ALT 10/14/2020 23  0 - 44 U/L Final  . Alkaline Phosphatase 10/14/2020 108  38 - 126 U/L Final  . Total Bilirubin 10/14/2020 0.5  0.3 - 1.2 mg/dL Final  . GFR, Estimated 10/14/2020 >60  >60 mL/min Final   Comment: (NOTE) Calculated using the CKD-EPI Creatinine Equation (2021)   . Anion gap 10/14/2020 10  5 - 15 Final   Performed at [facility], [place] [place], [place] [address]  . WBC 10/14/2020 12.4* 4.0 - 10.5 K/uL Final  . RBC 10/14/2020 5.51  4.22 - 5.81 MIL/uL Final  . Hemoglobin 10/14/2020 15.9  13.0 - 17.0 g/dL Final  . HCT 10/14/2020 47.7  39.0 - 52.0 % Final  . MCV 10/14/2020 86.6  80.0 - 100.0 fL Final  . MCH 10/14/2020 28.9  26.0 - 34.0 pg Final  . MCHC 10/14/2020 33.3  30.0 - 36.0 g/dL Final  . RDW 10/14/2020 13.8  11.5 - 15.5 % Final  . Platelets 10/14/2020 234  150 - 400 K/uL Final  . nRBC 10/14/2020 0.0  0.0 - 0.2 % Final  . Neutrophils Relative % 10/14/2020 65  % Final  . Neutro Abs 10/14/2020 8.2* 1.7 - 7.7 K/uL Final  . Lymphocytes Relative 10/14/2020 24  % Final  . Lymphs Abs 10/14/2020 2.9  0.7 - 4.0 K/uL Final  . Monocytes Relative 10/14/2020 7  % Final  . Monocytes Absolute 10/14/2020 0.9  0.1 - 1.0 K/uL Final  . Eosinophils Relative 10/14/2020 2  % Final  . Eosinophils Absolute 10/14/2020 0.2  0.0 - 0.5 K/uL Final  . Basophils Relative 10/14/2020 1  % Final  . Basophils Absolute 10/14/2020 0.1  0.0 - 0.1 K/uL Final  . Immature Granulocytes 10/14/2020 1  % Final  . Abs Immature Granulocytes 10/14/2020 0.15* 0.00 - 0.07 K/uL Final   Performed  at [facility], [address] [place], [place] [address]  . Color, Urine 10/14/2020 YELLOW  YELLOW Final  . APPearance 10/14/2020 CLEAR  CLEAR Final  . Specific Gravity, Urine 10/14/2020 1.015  1.005 - 1.030 Final  . pH 10/14/2020 6.0  5.0 - 8.0 Final  . Glucose, UA 10/14/2020 >=500* NEGATIVE mg/dL Final  . Hgb urine dipstick 10/14/2020 NEGATIVE  NEGATIVE Final  . Bilirubin Urine 10/14/2020 NEGATIVE  NEGATIVE Final  . Ketones, ur 10/14/2020 NEGATIVE  NEGATIVE mg/dL Final  . Protein, ur 10/14/2020 NEGATIVE  NEGATIVE mg/dL Final  . Nitrite 10/14/2020 NEGATIVE  NEGATIVE Final  . Chalmers Guest 10/14/2020 NEGATIVE  NEGATIVE Final   Performed at [facility], [address] [place], [place] [address]  . Glucose-Capillary 10/14/2020 406* 70 - 99 mg/dL Final   Glucose reference range applies only to samples taken after fasting for at least 8 hours.  . RBC / HPF 10/14/2020 0-5  0 - 5 RBC/hpf Final  . WBC, UA 10/14/2020 0-5  0 - 5 WBC/hpf Final  . Bacteria, UA 10/14/2020 RARE* NONE SEEN Final  . Squamous Epithelial / LPF 10/14/2020 0-5  0 - 5 Final   Performed at [facility], [address], [place], [place] [address]  Admission on 08/28/2020, Discharged on 08/31/2020  Component Date Value Ref Range Status  . WBC 08/28/2020 22.1* 4.0 - 10.5 K/uL Final  . RBC 08/28/2020 5.11  4.22 - 5.81 MIL/uL Final  . Hemoglobin 08/28/2020 14.5  13.0 - 17.0 g/dL Final  . HCT 08/28/2020 43.1  39.0 - 52.0 % Final  . MCV 08/28/2020 84.3  80.0 - 100.0 fL Final  . MCH 08/28/2020 28.4  26.0 - 34.0 pg Final  . MCHC 08/28/2020 33.6  30.0 - 36.0 g/dL Final  . RDW 08/28/2020 13.6  11.5 - 15.5 % Final  . Platelets 08/28/2020 298  150 - 400 K/uL Final  . nRBC 08/28/2020 0.0  0.0 - 0.2 % Final  . Neutrophils Relative % 08/28/2020 71  % Final  . Neutro Abs 08/28/2020 15.7* 1.7 - 7.7 K/uL Final  . Lymphocytes Relative 08/28/2020 18  % Final  . Lymphs Abs 08/28/2020 4.0  0.7 - 4.0 K/uL  Final  . Monocytes Relative 08/28/2020 6  % Final  . Monocytes Absolute 08/28/2020 1.3* 0.1 - 1.0 K/uL Final  . Eosinophils Relative 08/28/2020 2  % Final  . Eosinophils Absolute 08/28/2020 0.5  0.0 - 0.5 K/uL Final  . Basophils Relative 08/28/2020 1  % Final  . Basophils Absolute 08/28/2020 0.2* 0.0 - 0.1 K/uL Final  . Immature Granulocytes 08/28/2020 2  % Final  . Abs Immature Granulocytes 08/28/2020 0.47* 0.00 - 0.07 K/uL Final   Performed at [facility], [address] [place], [place] [address]  . Sodium 08/28/2020 130* 135 - 145 mmol/L Final  . Potassium 08/28/2020 4.2  3.5 - 5.1 mmol/L Final  . Chloride 08/28/2020 99  98 - 111 mmol/L Final  . CO2 08/28/2020 22  22 - 32 mmol/L Final  . Glucose, Bld 08/28/2020 553* 70 - 99 mg/dL Final   Comment: Glucose reference range applies only to samples taken after fasting for at least 8 hours. CRITICAL RESULT CALLED TO, READ BACK BY AND VERIFIED WITH: WALTON,M AT [place] ON [id number] BY CHERESNOWSKY,T LIPEMIC SPECIMEN   . BUN 08/28/2020 11  6 - 20 mg/dL Final  . Creatinine, Ser 08/28/2020 0.63  0.61 - 1.24 mg/dL Final  . Calcium 08/28/2020 8.9  8.9 - 10.3 mg/dL Final  . GFR calc non Af Amer 08/28/2020 >60  >60 mL/min Final  . GFR calc Af Amer 08/28/2020 >60  >60 mL/min Final  . Anion gap 08/28/2020 9  5 - 15 Final   Performed at [facility], [address] [place], [place] [address]  . Color, Urine 08/28/2020 COLORLESS* YELLOW Final  . APPearance 08/28/2020 CLEAR  CLEAR Final  . Specific Gravity, Urine 08/28/2020 <1.005* 1.005 - 1.030 Final  . pH 08/28/2020 7.0  5.0 - 8.0 Final  . Glucose, UA 08/28/2020 >=500* NEGATIVE mg/dL Final  . Hgb urine dipstick 08/28/2020 TRACE* NEGATIVE Final  . Bilirubin Urine 08/28/2020 NEGATIVE  NEGATIVE Final  . Ketones, ur 08/28/2020 NEGATIVE  NEGATIVE mg/dL Final  . Protein, ur 08/28/2020 NEGATIVE  NEGATIVE mg/dL Final  . Nitrite 08/28/2020 NEGATIVE  NEGATIVE Final  . Chalmers Guest  08/28/2020 NEGATIVE  NEGATIVE Final   Performed at [facility], [address] [place], [place] [address]  . RBC / HPF 08/28/2020 0-5  0 - 5 RBC/hpf Final  . WBC, UA 08/28/2020 NONE SEEN  0 - 5 WBC/hpf Final  . Bacteria, UA 08/28/2020 NONE SEEN  NONE SEEN Final  . Squamous Epithelial /  LPF 08/28/2020 NONE SEEN  0 - 5 Final   Performed at [facility], [address] [place], [place] [address]  . Specimen Description 08/28/2020    Final                   Value:BLOOD RIGHT ANTECUBITAL Performed at [facility], [address] [place], [place] [address]   . Special Requests 08/28/2020    Final                   Value:BOTTLES DRAWN AEROBIC AND ANAEROBIC Blood Culture adequate volume Performed at [facility], [address] [place], [place] [address]   . Culture 08/28/2020    Final                   Value:NO GROWTH 5 DAYS Performed at [facility], [place] [address] [place], [place] [address]   . Report Status 08/28/2020 09/02/2020 FINAL   Final  . Specimen Description 08/28/2020    Final                   Value:BLOOD RIGHT FOREARM Performed at [facility], [address] [place], [place] [address]   . Special Requests 08/28/2020    Final                   Value:BOTTLES DRAWN AEROBIC AND ANAEROBIC Blood Culture adequate volume Performed at [facility], [address] [place], [place] [address]   . Culture 08/28/2020    Final                   Value:NO GROWTH 5 DAYS Performed at [facility], [place] [address], [place], [place] [address]   . Report Status 08/28/2020 09/02/2020 FINAL   Final  . SARS Coronavirus 2 08/28/2020 NEGATIVE  NEGATIVE Final   Comment: (NOTE) SARS-CoV-2 target nucleic acids are NOT DETECTED.  The SARS-CoV-2 RNA is generally detectable in upper and lower respiratory specimens during the acute phase of infection. The lowest concentration of SARS-CoV-2 viral copies this assay can  detect is 250 copies / mL. A negative result does not preclude SARS-CoV-2 infection and should not be used as the sole basis for treatment or other patient management decisions.  A negative result may occur with improper specimen collection / handling, submission of specimen other than nasopharyngeal swab, presence of viral mutation(s) within the areas targeted by this assay, and inadequate number of viral copies (<250 copies / mL). A negative result must be combined with clinical observations, patient history, and epidemiological information.  Fact Sheet for Patients:   StrictlyIdeas.no  Fact Sheet for Healthcare Providers: BankingDealers.co.za  This test is not yet approved or                           cleared by the [place] FDA and has been authorized for detection and/or diagnosis of SARS-CoV-2 by FDA under an Emergency Use Authorization (EUA).  This EUA will remain in effect (meaning this test can be used) for the duration of the COVID-19 declaration under Section 564(b)(1) of the Act, 21 U.S.C. section 360bbb-3(b)(1), unless the authorization is terminated or revoked sooner.  Performed at [facility], [address] [place],  [address]   . Opiates 08/28/2020 NONE DETECTED  NONE DETECTED Final  . Cocaine  08/28/2020 NONE DETECTED  NONE DETECTED Final  . Benzodiazepines 08/28/2020 NONE DETECTED  NONE DETECTED Final  . Amphetamines 08/28/2020 NONE DETECTED  NONE DETECTED Final  . Tetrahydrocannabinol 08/28/2020 POSITIVE* NONE DETECTED Final  . Barbiturates 08/28/2020 NONE DETECTED  NONE DETECTED Final   Comment: (NOTE) DRUG SCREEN FOR MEDICAL PURPOSES ONLY.  IF CONFIRMATION IS NEEDED FOR ANY PURPOSE, NOTIFY LAB WITHIN 5 DAYS.  LOWEST DETECTABLE LIMITS FOR URINE DRUG SCREEN Drug Class                     Cutoff (ng/mL) Amphetamine and metabolites    1000 Barbiturate and metabolites     200 Benzodiazepine                 676 Tricyclics and metabolites     300 Opiates and metabolites        300 Cocaine and metabolites        300 THC                            50 Performed at [facility], [address] [place], [place] [address]   . Glucose-Capillary 08/28/2020 348* 70 - 99 mg/dL Final   Glucose reference range applies only to samples taken after fasting for at least 8 hours.  . Glucose-Capillary 08/28/2020 377* 70 - 99 mg/dL Final   Glucose reference range applies only to samples taken after fasting for at least 8 hours.  . Glucose-Capillary 08/28/2020 430* 70 - 99 mg/dL Final   Glucose reference range applies only to samples taken after fasting for at least 8 hours.  . Total CK 08/28/2020 31* 49 - 397 U/L Final   Performed at [facility], [place] [address] [place], [place] [address]  . QuantiFERON Incubation 08/28/2020 Incubation performed.   Final  . QuantiFERON-TB Gold Plus 08/28/2020 Negative  Negative Final   Comment: (NOTE) Chemiluminescence immunoassay methodology Performed At: [facility] [place], [place] [id number] Rush Farmer MD JA:[id number]   . HIV Screen 4th Generation wRfx 08/28/2020 Non Reactive  Non Reactive Final   Performed at [facility], [place] [address], [place], [place] [address]  . Hgb A1c MFr Bld 08/28/2020 13.3* 4.8 - 5.6 % Final   Comment: (NOTE) Pre diabetes:          5.7%-6.4%  Diabetes:              >6.4%  Glycemic control for   <7.0% adults with diabetes   . Mean Plasma Glucose 08/28/2020 335.01  mg/dL Final   Performed at [facility] [address], [place], [place] [address]  . MRSA by PCR 08/28/2020 NEGATIVE  NEGATIVE Final   Comment:        The GeneXpert MRSA Assay (FDA approved for NASAL specimens only), is one component of a comprehensive MRSA colonization surveillance program. It is not intended to diagnose MRSA infection nor to guide or monitor treatment  for MRSA infections. Performed at [facility], [place] [address] [place], [place] [address]   . Glucose-Capillary 08/28/2020 276* 70 - 99 mg/dL Final   Glucose reference range applies only to samples taken after fasting for at least 8 hours.  . Glucose-Capillary 08/28/2020 328* 70 - 99 mg/dL Final   Glucose reference range applies only to samples taken after fasting for at least 8 hours.  . Glucose-Capillary 08/29/2020 275* 70 -  99 mg/dL Final   Glucose reference range applies only to samples taken after fasting for at least 8 hours.  . Glucose-Capillary 08/29/2020 262* 70 - 99 mg/dL Final   Glucose reference range applies only to samples taken after fasting for at least 8 hours.  . SURGICAL PATHOLOGY 08/29/2020    Final-Edited                   Value:SURGICAL PATHOLOGY CASE: [id number] PATIENT: [facility] Surgical Pathology Report     Clinical History: posterior neck abscess (cm)     FINAL MICROSCOPIC DIAGNOSIS:  A. SOFT TISSUE, POSTERIOR NECK, ABSCESS: - Findings consistent with abscess.   GROSS DESCRIPTION:  Received fresh is a 2.2 x 1.6 x 0.6 cm aggregate of irregular tan-pink soft tissue, sectioned and submitted entirely in 1 cassette.  (AK 08/29/2020)    Final Diagnosis performed by Vicente Males, MD.   Electronically signed 09/01/2020 Technical component performed at [employer], [place] [address], [place], [place] [address].  Professional component performed at [facility], [place] [address], [place], [place] [address].  Immunohistochemistry Technical component (if applicable) was performed at [facility]. [address], [place], [place], [place] [address].   IMMUNOHISTOCHEMISTRY DISCLAIMER (if applicable): Some of these immunohistoche                         mical stains may have been developed and the performance characteristics determine by [facility]. Some may not have been  cleared or approved by the U.S. Food and Drug Administration. The FDA has determined that such clearance or approval is not necessary. This test is used for clinical purposes. It should not be regarded as investigational or for research. This laboratory is certified under the [place] (CLIA-88) as qualified to perform high complexity clinical laboratory testing.  The controls stained appropriately.   Marland Kitchen Specimen Description 08/29/2020 ABSCESS LEFT NECK   Final  . Special Requests 08/29/2020 NONE   Final  . Gram Stain 08/29/2020    Final                   Value:FEW WBC PRESENT, PREDOMINANTLY PMN FEW GRAM POSITIVE COCCI IN PAIRS   . Culture 08/29/2020    Final                   Value:RARE GROUP A STREP (S.PYOGENES) ISOLATED Beta hemolytic streptococci are predictably susceptible to penicillin and other beta lactams. Susceptibility testing not routinely performed. NO ANAEROBES ISOLATED Performed at [facility], [place] [address], [place], [place] [address]   . Report Status 08/29/2020 09/03/2020 FINAL   Final  . Glucose-Capillary 08/29/2020 276* 70 - 99 mg/dL Final   Glucose reference range applies only to samples taken after fasting for at least 8 hours.  . Glucose-Capillary 08/29/2020 289* 70 - 99 mg/dL Final   Glucose reference range applies only to samples taken after fasting for at least 8 hours.  . Glucose-Capillary 08/29/2020 367* 70 - 99 mg/dL Final   Glucose reference range applies only to samples taken after fasting for at least 8 hours.  . WBC 08/30/2020 10.4  4.0 - 10.5 K/uL Final  . RBC 08/30/2020 5.22  4.22 - 5.81 MIL/uL Final  . Hemoglobin 08/30/2020 14.1  13.0 - 17.0 g/dL Final  . HCT 08/30/2020 44.5  39.0 - 52.0 % Final  . MCV 08/30/2020 85.2  80.0 - 100.0 fL Final  . MCH 08/30/2020 27.0  26.0 - 34.0 pg Final  . MCHC 08/30/2020 31.7  30.0 - 36.0 g/dL Final  . RDW 08/30/2020 13.9  11.5 - 15.5 % Final  . Platelets 08/30/2020  253  150 - 400 K/uL Final  . nRBC 08/30/2020 0.0  0.0 - 0.2 % Final   Performed at [facility], [place] [address] [place], [place] [address]  . Specimen Description 08/30/2020 BLOOD LEFT ANTECUBITAL   Final  . Special Requests 08/30/2020 BOTTLES DRAWN AEROBIC AND ANAEROBIC Blood Culture results may not be optimal due to an inadequate volume of blood received in culture bottles   Final  . Culture 08/30/2020    Final                   Value:NO GROWTH 5 DAYS Performed at [facility], [place] [address], [place], [place] [address]   . Report Status 08/30/2020 09/04/2020 FINAL   Final  . Sodium 08/30/2020 133* 135 - 145 mmol/L Final  . Potassium 08/30/2020 3.9  3.5 - 5.1 mmol/L Final  . Chloride 08/30/2020 99  98 - 111 mmol/L Final  . CO2 08/30/2020 23  22 - 32 mmol/L Final  . Glucose, Bld 08/30/2020 359* 70 - 99 mg/dL Final   Glucose reference range applies only to samples taken after fasting for at least 8 hours.  . BUN 08/30/2020 7  6 - 20 mg/dL Final  . Creatinine, Ser 08/30/2020 0.65  0.61 - 1.24 mg/dL Final  . Calcium 08/30/2020 9.0  8.9 - 10.3 mg/dL Final  . GFR calc non Af Amer 08/30/2020 >60  >60 mL/min Final  . GFR calc Af Amer 08/30/2020 >60  >60 mL/min Final  . Anion gap 08/30/2020 11  5 - 15 Final   Performed at [facility], [place] [address] [place], [place] [address]  . Glucose-Capillary 08/29/2020 447* 70 - 99 mg/dL Final   Glucose reference range applies only to samples taken after fasting for at least 8 hours.  . Glucose-Capillary 08/29/2020 362* 70 - 99 mg/dL Final   Glucose reference range applies only to samples taken after fasting for at least 8 hours.  . Glucose-Capillary 08/30/2020 388* 70 - 99 mg/dL Final   Glucose reference range applies only to samples taken after fasting for at least 8 hours.  . Glucose-Capillary 08/30/2020 382* 70 - 99 mg/dL Final   Glucose reference range applies only to samples taken after fasting for at least 8 hours.  .  Glucose-Capillary 08/30/2020 360* 70 - 99 mg/dL Final   Glucose reference range applies only to samples taken after fasting for at least 8 hours.  . Glucose-Capillary 08/30/2020 226* 70 - 99 mg/dL Final   Glucose reference range applies only to samples taken after fasting for at least 8 hours.  . QuantiFERON Criteria 08/28/2020 Comment   Final   Comment: (NOTE) The QuantiFERON-TB Gold Plus result is determined by subtracting the Nil value from either TB antigen (Ag) tube. The mitogen tube serves as a control for the test.   . QuantiFERON TB1 Ag Value 08/28/2020 0.09  IU/mL Final  . QuantiFERON TB2 Ag Value 08/28/2020 0.13  IU/mL Final  . QuantiFERON Nil Value 08/28/2020 0.12  IU/mL Final  . QuantiFERON Mitogen Value 08/28/2020 6.19  IU/mL Final   Comment: (NOTE) Performed At: [facility] [place], [place] [id number] Rush Farmer MD [id number]   . Sodium 08/31/2020 131*  135 - 145 mmol/L Final  . Potassium 08/31/2020 4.4  3.5 - 5.1 mmol/L Final  . Chloride 08/31/2020 97* 98 - 111 mmol/L Final  . CO2 08/31/2020 26  22 - 32 mmol/L Final  . Glucose, Bld 08/31/2020 434* 70 - 99 mg/dL Final   Glucose reference range applies only to samples taken after fasting for at least 8 hours.  . BUN 08/31/2020 10  6 - 20 mg/dL Final  . Creatinine, Ser 08/31/2020 0.73  0.61 - 1.24 mg/dL Final  . Calcium 08/31/2020 9.0  8.9 - 10.3 mg/dL Final  . GFR calc non Af Amer 08/31/2020 >60  >60 mL/min Final  . GFR calc Af Amer 08/31/2020 >60  >60 mL/min Final  . Anion gap 08/31/2020 8  5 - 15 Final   Performed at [facility], [place] [address] [place], [place] [address]  . WBC 08/31/2020 10.1  4.0 - 10.5 K/uL Final  . RBC 08/31/2020 5.00  4.22 - 5.81 MIL/uL Final  . Hemoglobin 08/31/2020 13.6  13.0 - 17.0 g/dL Final  . HCT 08/31/2020 42.9  39.0 - 52.0 % Final  . MCV 08/31/2020 85.8  80.0 - 100.0 fL Final  . MCH 08/31/2020 27.2  26.0 - 34.0 pg Final  . MCHC 08/31/2020 31.7   30.0 - 36.0 g/dL Final  . RDW 08/31/2020 14.2  11.5 - 15.5 % Final  . Platelets 08/31/2020 277  150 - 400 K/uL Final  . nRBC 08/31/2020 0.0  0.0 - 0.2 % Final  . Neutrophils Relative % 08/31/2020 55  % Final  . Neutro Abs 08/31/2020 5.5  1.7 - 7.7 K/uL Final  . Lymphocytes Relative 08/31/2020 32  % Final  . Lymphs Abs 08/31/2020 3.3  0.7 - 4.0 K/uL Final  . Monocytes Relative 08/31/2020 6  % Final  . Monocytes Absolute 08/31/2020 0.6  0.1 - 1.0 K/uL Final  . Eosinophils Relative 08/31/2020 3  % Final  . Eosinophils Absolute 08/31/2020 0.3  0.0 - 0.5 K/uL Final  . Basophils Relative 08/31/2020 1  % Final  . Basophils Absolute 08/31/2020 0.1  0.0 - 0.1 K/uL Final  . Immature Granulocytes 08/31/2020 3  % Final  . Abs Immature Granulocytes 08/31/2020 0.34* 0.00 - 0.07 K/uL Final   Performed at [facility], [place] [address], [place],  [address]  . Glucose-Capillary 08/31/2020 387* 70 - 99 mg/dL Final   Glucose reference range applies only to samples taken after fasting for at least 8 hours.  . Glucose-Capillary 08/31/2020 412* 70 - 99 mg/dL Final   Glucose reference range applies only to samples taken after fasting for at least 8 hours.  . Glucose-Capillary 08/31/2020 354* 70 - 99 mg/dL Final   Glucose reference range applies only to samples taken after fasting for at least 8 hours.    Allergies: Influenza virus vac live quad, Nsaids, Toradol [ketorolac tromethamine], and Tramadol  PTA Medications: (Not in a hospital admission)   Medical Decision Making  Patient admitted to Continuous Assessment Unit while waiting for appropriate bed for inpatient psychiatric treatment.  Routine labs and EKG ordered  Lab Orders     Resp Panel by RT-PCR (Flu A&B, Covid) Nasopharyngeal Swab     CBC with Differential/Platelet     Comprehensive metabolic panel     Hemoglobin A1c     Magnesium     Ethanol     Lipid panel     TSH     Urinalysis, Routine w reflex microscopic  Urine, Clean  Catch     POC SARS Coronavirus 2 Ag-ED - Nasal Swab (BD Veritor Kit)     POCT Urine Drug Screen - ([facility])    Medication Management:  Started the following medications Trazodone 50 mg Q hs prn for Sleep Vistaril 25 mg Tid prn anxiety Seroquel XR 100 mg Q hs Gabapentin 100 mg Tid for anxiety/withdrawal    Recommendations  Based on my evaluation the patient does not appear to have an emergency medical condition.  Karleen Seebeck, NP 12/09/20  3:23 PM

## 2020-12-09 NOTE — ED Notes (Signed)
Pt A&O x 4, no distress noted,  Calm & cooperative, monitoring for safety.  Remains SI, contracts for safety.

## 2020-12-09 NOTE — ED Notes (Signed)
PT Refused EKG
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
## 2020-12-09 NOTE — Progress Notes (Signed)
Received Juan Roman in the OBS unit, he was cooperative with the admission  process he was oriented to his new environment  And offered nourishments. His BS was 333.

## 2020-12-09 NOTE — ED Notes (Signed)
Locker 14

## 2020-12-10 LAB — GLUCOSE, CAPILLARY
Glucose-Capillary: 368 mg/dL — ABNORMAL HIGH (ref 70–99)
Glucose-Capillary: 246 mg/dL — ABNORMAL HIGH (ref 70–99)
Glucose-Capillary: 251 mg/dL — ABNORMAL HIGH (ref 70–99)
Glucose-Capillary: 325 mg/dL — ABNORMAL HIGH (ref 70–99)

## 2020-12-10 MED ORDER — INSULIN GLARGINE 100 UNIT/ML ~~LOC~~ SOLN
55.0000 [IU] | Freq: Every day | SUBCUTANEOUS | Status: DC
  Administered 2020-12-10: 21:00:00 55 [IU] via SUBCUTANEOUS

## 2020-12-10 MED ORDER — INSULIN ASPART 100 UNIT/ML ~~LOC~~ SOLN
15.0000 [IU] | Freq: Three times a day (TID) | SUBCUTANEOUS | Status: DC
  Administered 2020-12-10 (×2): 15 [IU] via SUBCUTANEOUS

## 2020-12-10 NOTE — ED Notes (Signed)
Pt sleeping@this time. Breathing even and unlabored. Will continue to monitor for safety 

## 2020-12-10 NOTE — Progress Notes (Signed)
Inpatient Diabetes Program Recommendations  AACE/ADA: New Consensus Statement on Inpatient Glycemic Control (2015)  Target Ranges:  Prepandial:   less than 140 mg/dL      Peak postprandial:   less than 180 mg/dL (1-2 hours)      Critically ill patients:  140 - 180 mg/dL   Lab Results  Component Value Date   GLUCAP 368 (H) 12/10/2020   HGBA1C 12.3 (H) 12/09/2020    Review of Glycemic Control Results for [name], [name] (MRN [id number]) as of 12/10/2020 09:48  Ref. Range 12/09/2020 17:08 12/09/2020 21:08 12/10/2020 09:08  Glucose-Capillary Latest Ref Range: 70 - 99 mg/dL 711 (H) 657 (H) 903 (H)   Diabetes history: DM2 Outpatient Diabetes medications: Lantus 50 units + Novolog 10 units tid meal coverage Current orders for Inpatient glycemic control: Lantus 50 units + Novolog 10 units tid meal coverage   Inpatient Diabetes Program Recommendations:   -Increase Lantus to 55 units -Increase Novolog 15 units tid if eats 50% Secure chat sent to Dr. Bronwen Betters  Thank you, Juan Roman. Kayse Puccini, RN, MSN, CDE  Diabetes Coordinator Inpatient Glycemic Control Team Team Pager [phone] (8am-5pm) 12/10/2020 9:50 AM

## 2020-12-10 NOTE — ED Notes (Signed)
Pt given meal; salad, chips, drink

## 2020-12-10 NOTE — ED Notes (Signed)
Pt given meal

## 2020-12-10 NOTE — ED Notes (Signed)
Pt easily agitated upon awakening. Continue to endorse SI. Pt states, "I've been suicidal all my life". Refuses to elaborate any further. Informed pt to notify staff with urges or impulses to harm self. Denies HI. Safety maintained.

## 2020-12-10 NOTE — ED Notes (Signed)
Pt sleeping in no acute distress. RR even and unlabored. Safety maintained.
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
## 2020-12-10 NOTE — ED Notes (Signed)
Pt resting on pull out with eyes closed unlabored respirations through night with occassional periods of wakefulness. No needs, acute distress. Safety maintained.

## 2020-12-10 NOTE — ED Notes (Signed)
Pt sleeping in no acute distress. RR even and unlabored. Safety maintained. 

## 2020-12-11 NOTE — ED Notes (Signed)
Pt resting at present, no distress noted, monitoring for safety. 

## 2020-12-11 NOTE — ED Notes (Signed)
Pt taking a shower at present, no distress noted.  Pending DC for transport via [facility] transport to [facility].

## 2021-11-01 IMAGING — CT CT HEAD W/O CM
3 series · 15 of 47 positions shown, 18 images · non-contrast
Comparison: None.

CLINICAL DATA: Patient fell off a ladder and struck back of head.
Neck and back pain.

EXAM:
CT HEAD WITHOUT CONTRAST
CT CERVICAL SPINE WITHOUT CONTRAST
TECHNIQUE: Multidetector CT imaging of the head and cervical spine was
performed following the standard protocol without intravenous
contrast. Multiplanar CT image reconstructions of the cervical spine
were also generated.

[Series 2: head 5.0 h30s · axial · 0.43mm/px · z∈[-192,-57]mm · 9 of 33 slices shown, 12 images]
[im 3/33  brain]
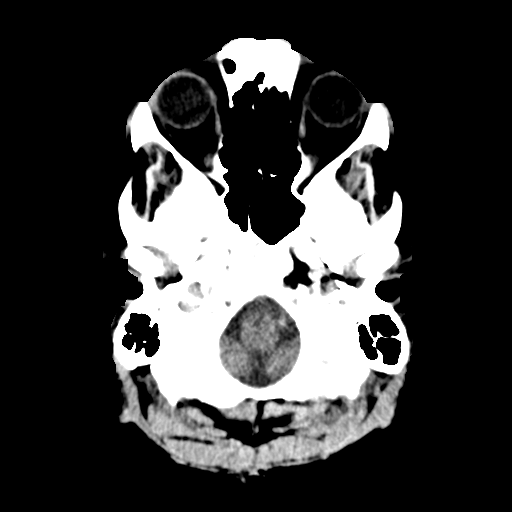
[im 3/33  bone]
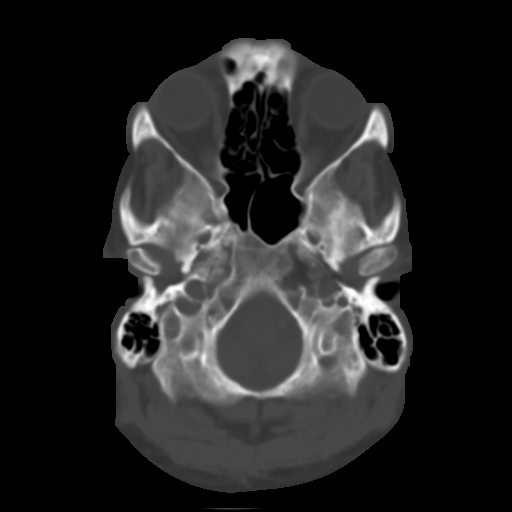
[im 6/33  brain]
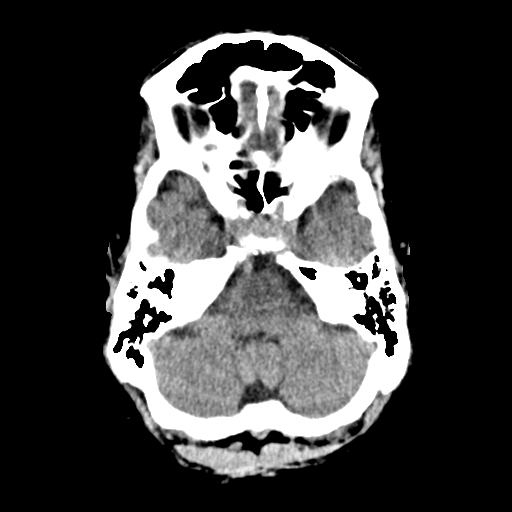
[im 9/33  brain]
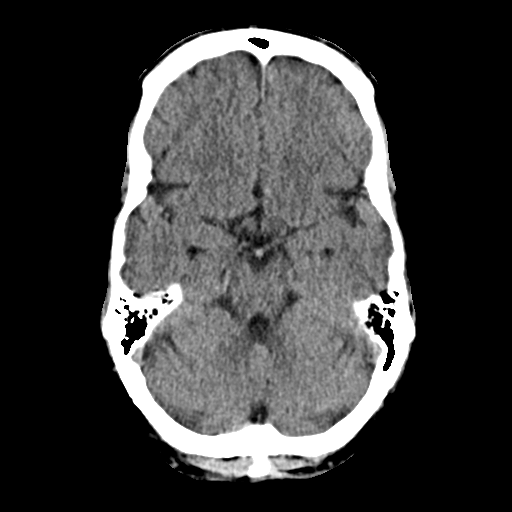
[im 13/33  brain]
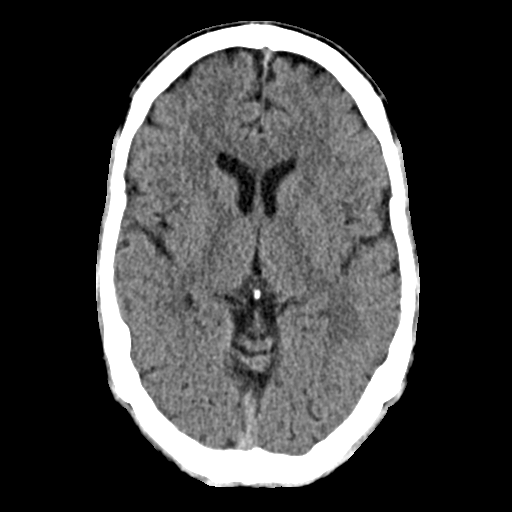
[im 17/33  brain]
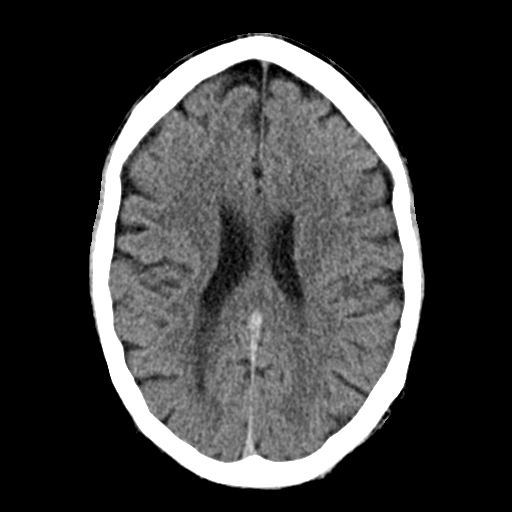
[im 17/33  bone]
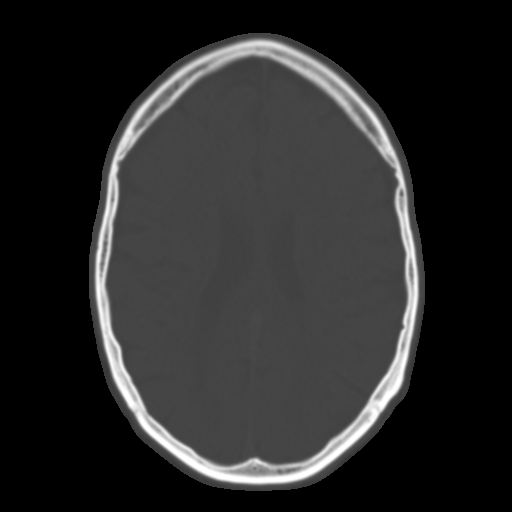
[im 20/33  brain]
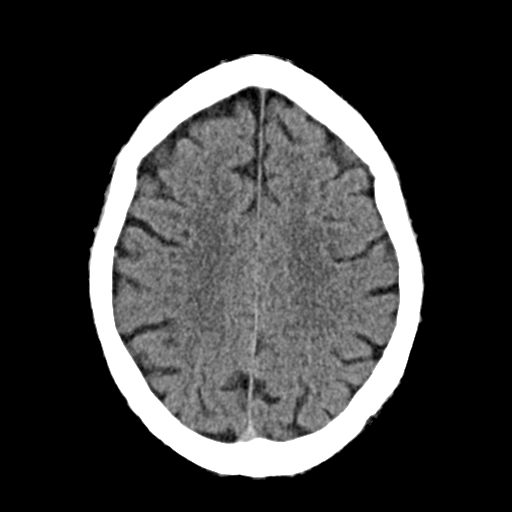
[im 24/33  brain]
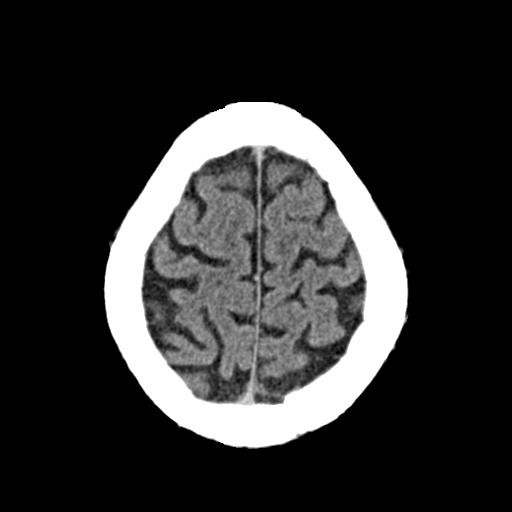
[im 27/33  brain]
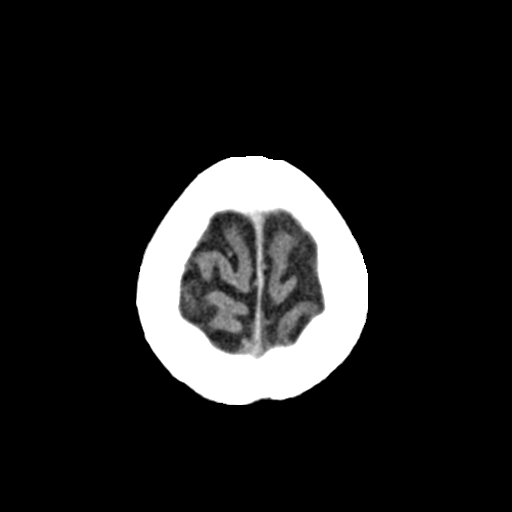
[im 30/33  brain]
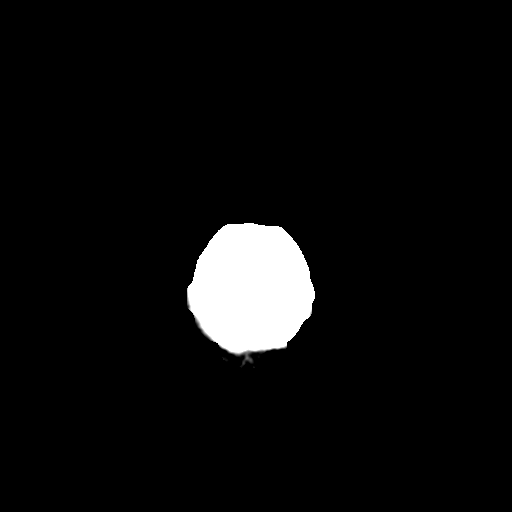
[im 30/33  bone]
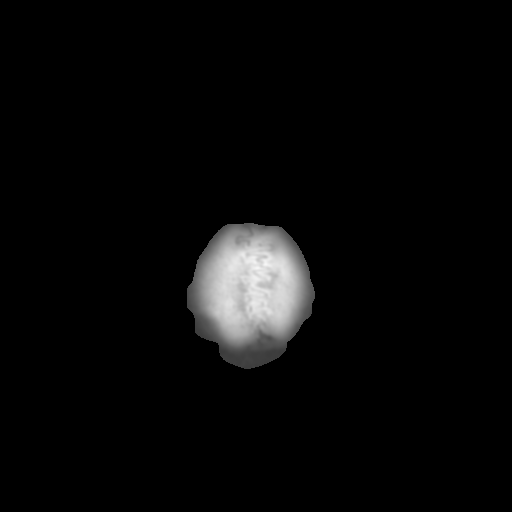

[Series 4: head 3.0 mpr cor · coronal · 0.30mm/px · 3 of 76 slices shown]
[im 26/76  brain]
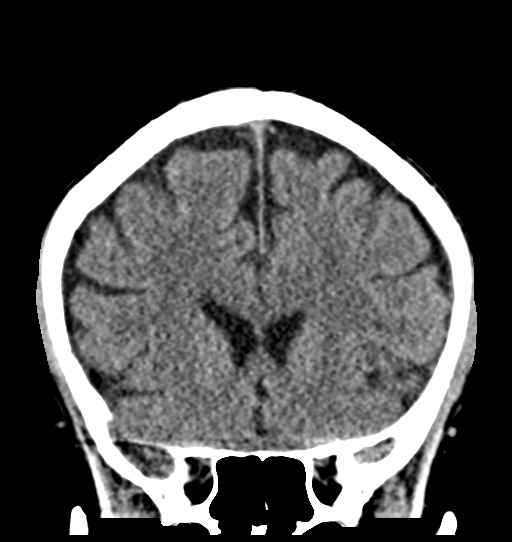
[im 34/76  brain]
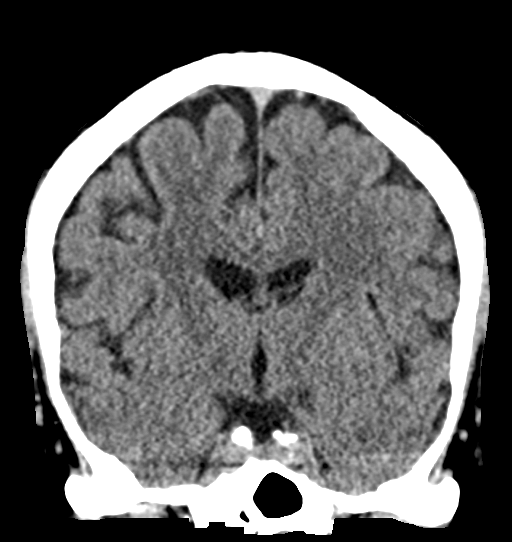
[im 42/76  brain]
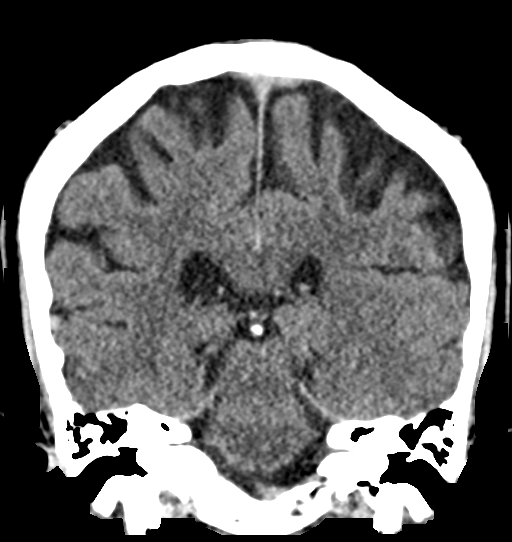

[Series 5: head 3.0 mpr sag · sagittal · 0.32mm/px · 3 of 55 slices shown]
[im 19/55  brain]
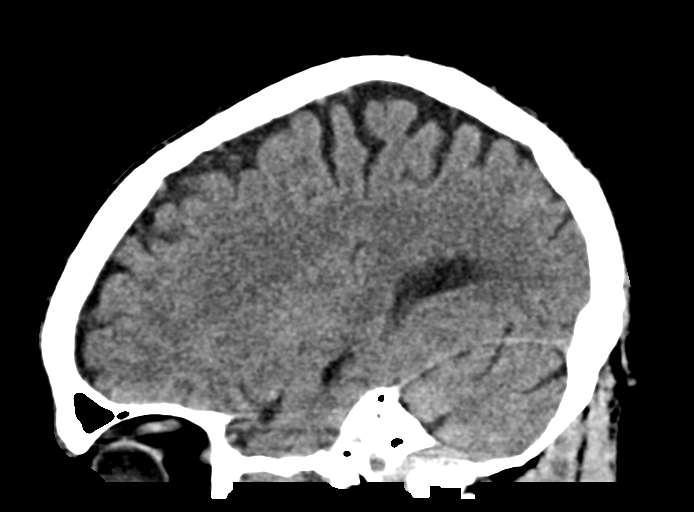
[im 28/55  brain]
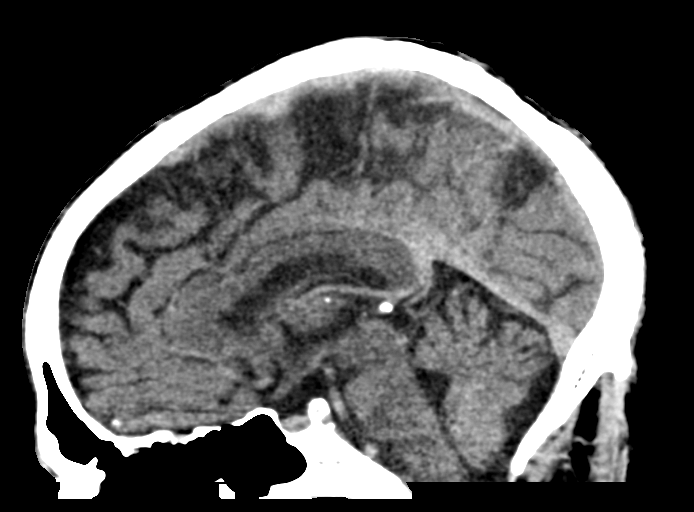
[im 37/55  brain]
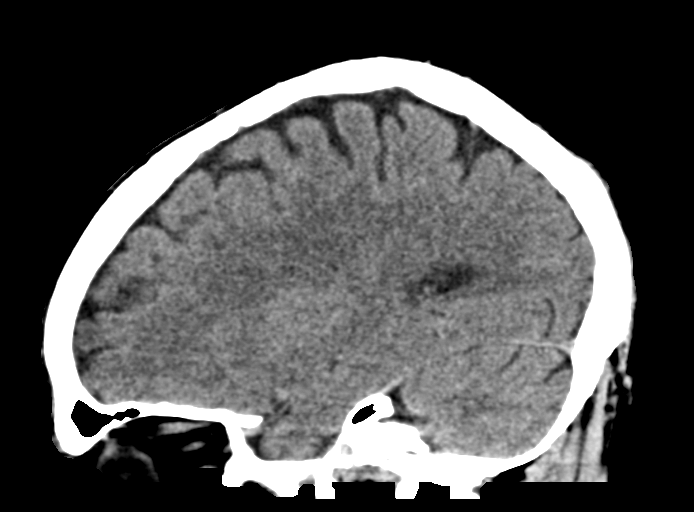

[15 of 47 positions shown; findings below may reference images not displayed]

FINDINGS: CT HEAD FINDINGS

Brain: There is no evidence for acute hemorrhage, hydrocephalus,
mass lesion, or abnormal extra-axial fluid collection. No definite
CT evidence for acute infarction.

Vascular: No hyperdense vessel or unexpected calcification.

Skull: No evidence for fracture. No worrisome lytic or sclerotic
lesion.

Sinuses/Orbits: The visualized paranasal sinuses and mastoid air
cells are clear. Visualized portions of the globes and intraorbital
fat are unremarkable.

Other: None.

CT CERVICAL SPINE FINDINGS

Alignment: Straightening of normal cervical lordosis without
subluxation.

Skull base and vertebrae: No acute fracture. No primary bone lesion
or focal pathologic process.

Soft tissues and spinal canal: No prevertebral fluid or swelling. No
visible canal hematoma.

Disc levels: Mild loss of disc height noted at C6-7 with endplate
spurring.

Upper chest: Negative.

Other: None.
IMPRESSION: 1. Unremarkable CT evaluation of the brain.  No acute abnormality.
2. No cervical spine fracture.
3. Loss of cervical lordosis. This can be related to patient
positioning, muscle spasm or soft tissue injury.
